# Patient Record
Sex: Male | Born: 1970
Health system: Southern US, Community
[De-identification: ages and names within clinical notes are randomized; demographics above are authoritative.]

## PROBLEM LIST (undated history)

## (undated) DIAGNOSIS — Z720 Tobacco use: Secondary | ICD-10-CM

## (undated) DIAGNOSIS — R079 Chest pain, unspecified: Secondary | ICD-10-CM

## (undated) DIAGNOSIS — I10 Essential (primary) hypertension: Secondary | ICD-10-CM

## (undated) DIAGNOSIS — G4730 Sleep apnea, unspecified: Secondary | ICD-10-CM

## (undated) DIAGNOSIS — Z8249 Family history of ischemic heart disease and other diseases of the circulatory system: Secondary | ICD-10-CM

## (undated) DIAGNOSIS — E785 Hyperlipidemia, unspecified: Secondary | ICD-10-CM

## (undated) DIAGNOSIS — H9190 Unspecified hearing loss, unspecified ear: Secondary | ICD-10-CM

## (undated) HISTORY — DX: Sleep apnea, unspecified: G47.30

## (undated) HISTORY — PX: COLONOSCOPY: SHX174

## (undated) HISTORY — DX: Chest pain, unspecified: R07.9

## (undated) HISTORY — DX: Hyperlipidemia, unspecified: E78.5

---

## 2009-10-19 ENCOUNTER — Emergency Department (HOSPITAL_COMMUNITY): Admission: EM | Admit: 2009-10-19 | Discharge: 2009-10-20 | Payer: Self-pay | Admitting: Emergency Medicine

## 2012-07-13 ENCOUNTER — Encounter (HOSPITAL_BASED_OUTPATIENT_CLINIC_OR_DEPARTMENT_OTHER): Payer: Managed Care, Other (non HMO)

## 2015-01-28 ENCOUNTER — Emergency Department
Admit: 2015-01-28 | Disposition: A | Discharge status: Home / Self Care | Payer: Self-pay | Admitting: Physician Assistant

## 2015-01-28 LAB — COMPREHENSIVE METABOLIC PANEL
ALBUMIN: 3.9 g/dL
ALK PHOS: 92 U/L
ALT: 27 U/L
Anion Gap: 6 — ABNORMAL LOW (ref 7–16)
BUN: 14 mg/dL
Bilirubin,Total: 0.7 mg/dL
CHLORIDE: 100 mmol/L — AB
Calcium, Total: 9.3 mg/dL
Co2: 29 mmol/L
Creatinine: 1.2 mg/dL
EGFR (African American): >60
EGFR (Non-African Amer.): >60
Glucose: 104 mg/dL — ABNORMAL HIGH
POTASSIUM: 3.7 mmol/L
SGOT(AST): 24 U/L
Sodium: 135 mmol/L
Total Protein: 8 g/dL

## 2015-01-28 LAB — CBC
HCT: 45.1 % (ref 40.0–52.0)
HGB: 15.1 g/dL (ref 13.0–18.0)
MCH: 27.9 pg (ref 26.0–34.0)
MCHC: 33.5 g/dL (ref 32.0–36.0)
MCV: 83 fL (ref 80–100)
Platelet: 285 10*3/uL (ref 150–440)
RBC: 5.41 10*6/uL (ref 4.40–5.90)
RDW: 13.3 % (ref 11.5–14.5)
WBC: 11.6 10*3/uL — AB (ref 3.8–10.6)

## 2015-01-30 LAB — BETA STREP CULTURE(ARMC)

## 2015-10-06 ENCOUNTER — Emergency Department (HOSPITAL_COMMUNITY): Payer: 59

## 2015-10-06 ENCOUNTER — Emergency Department (HOSPITAL_COMMUNITY)
Admission: EM | Admit: 2015-10-06 | Discharge: 2015-10-06 | Disposition: A | Discharge status: Home / Self Care | Payer: 59 | Attending: Emergency Medicine | Admitting: Emergency Medicine

## 2015-10-06 ENCOUNTER — Encounter (HOSPITAL_COMMUNITY): Payer: Self-pay | Admitting: *Deleted

## 2015-10-06 DIAGNOSIS — S99922A Unspecified injury of left foot, initial encounter: Secondary | ICD-10-CM | POA: Diagnosis not present

## 2015-10-06 DIAGNOSIS — Y9289 Other specified places as the place of occurrence of the external cause: Secondary | ICD-10-CM | POA: Diagnosis not present

## 2015-10-06 DIAGNOSIS — Y9389 Activity, other specified: Secondary | ICD-10-CM | POA: Insufficient documentation

## 2015-10-06 DIAGNOSIS — W1843XA Slipping, tripping and stumbling without falling due to stepping from one level to another, initial encounter: Secondary | ICD-10-CM | POA: Diagnosis not present

## 2015-10-06 DIAGNOSIS — Y990 Civilian activity done for income or pay: Secondary | ICD-10-CM | POA: Diagnosis not present

## 2015-10-06 MED ORDER — IBUPROFEN 200 MG PO TABS
600.0000 mg | ORAL_TABLET | Freq: Once | ORAL | Status: AC
  Administered 2015-10-06: 600 mg via ORAL
  Filled 2015-10-06: qty 3

## 2015-10-06 MED ORDER — IBUPROFEN 800 MG PO TABS: 800.0000 mg | ORAL_TABLET | Freq: Three times a day (TID) | ORAL | Status: DC | PRN

## 2015-10-06 NOTE — ED Notes (Signed)
Pt complains of pain and swelling to his left ankle since injuring it at 430PM yesterday. 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 Pt states he immediately noticed swelling. Pt states he iced his ankle, which he states helped.

## 2015-10-06 NOTE — ED Provider Notes (Signed)
CSN: [id number]     Arrival date & time 10/06/15  1415 History   By signing my name below, I, Evelene Croon, attest that this documentation has been prepared under the direction and in the presence of non-physician practitioner, Clayton Bibles, PA-C. Electronically Signed: Evelene Croon, Scribe. 10/06/2015. 3:45 PM.  Chief Complaint  Patient presents with  . Ankle Injury   The history is provided by the patient. No language interpreter was used.     HPI Comments:  Caleb Carter is a 44 y.o. male who presents to the Emergency Department complaining of throbbing right foot pain s/p injury ~ 1600 yesterday with associated moderate swelling to the foot. 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  He denies fall and other injury. He notes pain to the arch of the right foot as well. Pt states his pain is exacerbated with movement of the toes and when bearing weight on the foot. He has taken Excedrin with mild relief of pain, last dose yesterday ~ 1700, and applied ice with moderate relief of the swelling.  Denies other injury.    History reviewed. No pertinent past medical history. History reviewed. No pertinent past surgical history. No family history on file. Social History  Substance Use Topics  . Smoking status: Never Smoker   . Smokeless tobacco: None  . Alcohol Use: Yes    Review of Systems  Constitutional: Negative for fever and chills.  Musculoskeletal: Positive for myalgias and arthralgias.       Right foot  Skin: Negative for color change and wound.  Allergic/Immunologic: Negative for immunocompromised state.  Neurological: Negative for weakness and numbness.  Hematological: Does not bruise/bleed easily.  Psychiatric/Behavioral: Negative for self-injury (accidental).    Allergies  Review of patient's allergies indicates not on file.  Home Medications   Prior to Admission medications   Not on File   BP 168/110 mmHg   Pulse 109  Temp(Src) 98.1 F (36.7 C) (Oral)  Resp 18  SpO2 98% Physical Exam  Constitutional: He appears well-developed and well-nourished. No distress.  HENT:  Head: Normocephalic and atraumatic.  Neck: Neck supple.  Pulmonary/Chest: Effort normal.  Musculoskeletal:  Left proximal dorsal foot with diffuse edema  and tenderness just distal to the medial malleolus into the medial aspect of the foot Distal sensation intact FAROM of toes No tenderness of the malleoli; no break in skin Pulses intact  No calf edema or tenderness  Neurological: He is alert.  Skin: He is not diaphoretic.  Nursing note and vitals reviewed.   ED Course  Procedures   DIAGNOSTIC STUDIES:  Oxygen Saturation is 98% on RA, normal by my interpretation.    COORDINATION OF CARE:  3:37 PM Discussed treatment plan with pt at bedside and pt agreed to plan.  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 Osseous mineralization normal. Joint spaces preserved. No acute fracture, dislocation or bone destruction. IMPRESSION: Soft tissue swelling without acute bony abnormalities. Electronically Signed   By: Lavonia Dana M.D.   On: 10/06/2015 16:16  Dg Foot Complete Left  10/06/2015  CLINICAL DATA:  Injury while stepping from forklift with pain EXAM: LEFT FOOT - COMPLETE 3+ VIEW COMPARISON:  None. FINDINGS: Medial soft tissue swelling is noted. No acute fracture or dislocation is seen. IMPRESSION: Soft tissue swelling without acute bony abnormality. Electronically Signed   By: Inez Catalina M.D.   On: 10/06/2015 16:16   I have personally  reviewed and evaluated these images and lab results as part of my medical decision-making.   MDM   Final diagnoses:  Foot injury, left, initial encounter    Afebrile, nontoxic patient with injury to his left foot while stepping off a truck at work.  Significant edema and tenderness over medial left foot.  Neurovascularly intact.  Compartments soft.   Xray negative.  Work note written for 5 days, pt will need to be reassessed by PCP or occupational health if available if he is not improved enough to work at that point.  Pt states there is no light duty at his job. Hypertensive on arrival, rechecked and remains elevated.  Reports he is taking his lisinopril, has an appointment with [facility] (Dr Billey Chang) on Jan 10.  He is unaware if his BP is always this high or if it is elevated because of his foot - notes significant pain in his foot but declines pain medication.  Pt strongly advised to recheck his BP several days this week and write down the numbers, if it remains elevated to please follow up with PCP or urgent care - [facility] has Urgent Care pt may make use of.  D/C home with ace, postop shoe.  Pt declines crutches.  Discussed result, findings, treatment, and follow up  with patient.  Pt given return precautions.  Pt verbalizes understanding and agrees with plan.       I personally performed the services described in this documentation, which was scribed in my presence. The recorded information has been reviewed and is accurate.   Clayton Bibles, PA-C 10/06/15 1714  Veryl Speak, MD 10/07/15 [phone]

## 2015-10-06 NOTE — Discharge Instructions (Signed)
Read the information below.  You may return to the Emergency Department at any time for worsening condition or any new symptoms that concern you.  If you develop uncontrolled pain, weakness or numbness of the extremity, severe discoloration of the skin, or you are unable to walk or move your ankle or toes, return to the ER for a recheck.

## 2017-04-10 ENCOUNTER — Emergency Department (HOSPITAL_COMMUNITY): Payer: Federal, State, Local not specified - PPO

## 2017-04-10 ENCOUNTER — Encounter (HOSPITAL_COMMUNITY): Payer: Self-pay

## 2017-04-10 ENCOUNTER — Observation Stay (HOSPITAL_COMMUNITY)
Admission: EM | Admit: 2017-04-10 | Discharge: 2017-04-11 | Disposition: A | Discharge status: Home / Self Care | Payer: Federal, State, Local not specified - PPO | Attending: Internal Medicine | Admitting: Internal Medicine

## 2017-04-10 DIAGNOSIS — Z720 Tobacco use: Secondary | ICD-10-CM | POA: Diagnosis not present

## 2017-04-10 DIAGNOSIS — I10 Essential (primary) hypertension: Secondary | ICD-10-CM | POA: Diagnosis not present

## 2017-04-10 DIAGNOSIS — F17210 Nicotine dependence, cigarettes, uncomplicated: Secondary | ICD-10-CM | POA: Diagnosis not present

## 2017-04-10 DIAGNOSIS — H9191 Unspecified hearing loss, right ear: Secondary | ICD-10-CM | POA: Diagnosis not present

## 2017-04-10 DIAGNOSIS — H9190 Unspecified hearing loss, unspecified ear: Secondary | ICD-10-CM

## 2017-04-10 DIAGNOSIS — R079 Chest pain, unspecified: Secondary | ICD-10-CM | POA: Diagnosis not present

## 2017-04-10 DIAGNOSIS — I455 Other specified heart block: Secondary | ICD-10-CM

## 2017-04-10 DIAGNOSIS — Z8249 Family history of ischemic heart disease and other diseases of the circulatory system: Secondary | ICD-10-CM | POA: Insufficient documentation

## 2017-04-10 DIAGNOSIS — R0781 Pleurodynia: Secondary | ICD-10-CM | POA: Diagnosis present

## 2017-04-10 DIAGNOSIS — E785 Hyperlipidemia, unspecified: Principal | ICD-10-CM | POA: Insufficient documentation

## 2017-04-10 DIAGNOSIS — H918X1 Other specified hearing loss, right ear: Secondary | ICD-10-CM | POA: Diagnosis not present

## 2017-04-10 DIAGNOSIS — G4733 Obstructive sleep apnea (adult) (pediatric): Secondary | ICD-10-CM

## 2017-04-10 DIAGNOSIS — Z9989 Dependence on other enabling machines and devices: Secondary | ICD-10-CM

## 2017-04-10 DIAGNOSIS — I491 Atrial premature depolarization: Secondary | ICD-10-CM

## 2017-04-10 DIAGNOSIS — R001 Bradycardia, unspecified: Secondary | ICD-10-CM

## 2017-04-10 HISTORY — DX: Family history of ischemic heart disease and other diseases of the circulatory system: Z82.49

## 2017-04-10 HISTORY — DX: Tobacco use: Z72.0

## 2017-04-10 HISTORY — DX: Unspecified hearing loss, unspecified ear: H91.90

## 2017-04-10 HISTORY — DX: Essential (primary) hypertension: I10

## 2017-04-10 LAB — CREATININE, SERUM
CREATININE: 1.21 mg/dL (ref 0.61–1.24)
GFR calc Af Amer: >60 mL/min (ref >60)

## 2017-04-10 LAB — CBC
HEMATOCRIT: 36.7 % — AB (ref 39.0–52.0)
Hemoglobin: 12.3 g/dL — ABNORMAL LOW (ref 13.0–17.0)
MCH: 28.2 pg (ref 26.0–34.0)
MCHC: 33.5 g/dL (ref 30.0–36.0)
MCV: 84.2 fL (ref 78.0–100.0)
Platelets: 242 10*3/uL (ref 150–400)
RBC: 4.36 MIL/uL (ref 4.22–5.81)
RDW: 12.8 % (ref 11.5–15.5)
WBC: 8.9 10*3/uL (ref 4.0–10.5)

## 2017-04-10 LAB — CBC WITH DIFFERENTIAL/PLATELET
Basophils Absolute: 0 10*3/uL (ref 0.0–0.1)
Basophils Relative: 0 %
EOS ABS: 0.1 10*3/uL (ref 0.0–0.7)
EOS PCT: 1 %
HCT: 38.5 % — ABNORMAL LOW (ref 39.0–52.0)
HEMOGLOBIN: 13.1 g/dL (ref 13.0–17.0)
LYMPHS ABS: 1.7 10*3/uL (ref 0.7–4.0)
Lymphocytes Relative: 17 %
MCH: 28.8 pg (ref 26.0–34.0)
MCHC: 34 g/dL (ref 30.0–36.0)
MCV: 84.6 fL (ref 78.0–100.0)
MONO ABS: 0.5 10*3/uL (ref 0.1–1.0)
MONOS PCT: 5 %
NEUTROS PCT: 77 %
Neutro Abs: 7.9 10*3/uL — ABNORMAL HIGH (ref 1.7–7.7)
Platelets: 257 10*3/uL (ref 150–400)
RBC: 4.55 MIL/uL (ref 4.22–5.81)
RDW: 12.8 % (ref 11.5–15.5)
WBC: 10.2 10*3/uL (ref 4.0–10.5)

## 2017-04-10 LAB — COMPREHENSIVE METABOLIC PANEL
ALBUMIN: 3.6 g/dL (ref 3.5–5.0)
ALK PHOS: 69 U/L (ref 38–126)
ALT: 22 U/L (ref 17–63)
AST: 25 U/L (ref 15–41)
Anion gap: 8 (ref 5–15)
BUN: 9 mg/dL (ref 6–20)
CALCIUM: 9.4 mg/dL (ref 8.9–10.3)
CHLORIDE: 106 mmol/L (ref 101–111)
CO2: 26 mmol/L (ref 22–32)
CREATININE: 1.23 mg/dL (ref 0.61–1.24)
GFR calc Af Amer: >60 mL/min (ref >60)
GFR calc non Af Amer: >60 mL/min (ref >60)
GLUCOSE: 88 mg/dL (ref 65–99)
Potassium: 3.7 mmol/L (ref 3.5–5.1)
SODIUM: 140 mmol/L (ref 135–145)
Total Bilirubin: 0.7 mg/dL (ref 0.3–1.2)
Total Protein: 6.8 g/dL (ref 6.5–8.1)

## 2017-04-10 LAB — PROTIME-INR
INR: 1.06
Prothrombin Time: 13.8 seconds (ref 11.4–15.2)

## 2017-04-10 LAB — TROPONIN I: Troponin I: <0.03 ng/mL (ref <0.03)

## 2017-04-10 LAB — TSH: TSH: 1.479 u[IU]/mL (ref 0.350–4.500)

## 2017-04-10 LAB — D-DIMER, QUANTITATIVE (NOT AT ARMC)

## 2017-04-10 LAB — I-STAT TROPONIN, ED: Troponin i, poc: 0 ng/mL (ref 0.00–0.08)

## 2017-04-10 MED ORDER — SODIUM CHLORIDE 0.9% FLUSH
3.0000 mL | Freq: Two times a day (BID) | INTRAVENOUS | Status: DC
  Administered 2017-04-11: 3 mL via INTRAVENOUS

## 2017-04-10 MED ORDER — HEPARIN SODIUM (PORCINE) 5000 UNIT/ML IJ SOLN
5000.0000 [IU] | Freq: Three times a day (TID) | INTRAMUSCULAR | Status: DC
  Administered 2017-04-10 – 2017-04-11 (×3): 5000 [IU] via SUBCUTANEOUS
  Filled 2017-04-10 (×3): qty 1

## 2017-04-10 MED ORDER — ONDANSETRON HCL 4 MG/2ML IJ SOLN: 4.0000 mg | Freq: Four times a day (QID) | INTRAMUSCULAR | Status: DC | PRN

## 2017-04-10 MED ORDER — LISINOPRIL-HYDROCHLOROTHIAZIDE 10-12.5 MG PO TABS
1.0000 | ORAL_TABLET | Freq: Every day | ORAL | Status: DC
Start: 2017-04-10 — End: 2017-04-10

## 2017-04-10 MED ORDER — NITROGLYCERIN 0.4 MG SL SUBL: 0.4000 mg | SUBLINGUAL_TABLET | SUBLINGUAL | Status: DC | PRN

## 2017-04-10 MED ORDER — SODIUM CHLORIDE 0.9% FLUSH: 3.0000 mL | INTRAVENOUS | Status: DC | PRN

## 2017-04-10 MED ORDER — ASPIRIN EC 81 MG PO TBEC
81.0000 mg | DELAYED_RELEASE_TABLET | Freq: Every day | ORAL | Status: DC
  Administered 2017-04-11: 81 mg via ORAL
  Filled 2017-04-10: qty 1

## 2017-04-10 MED ORDER — ACETAMINOPHEN 325 MG PO TABS
650.0000 mg | ORAL_TABLET | ORAL | Status: DC | PRN
  Filled 2017-04-10: qty 2

## 2017-04-10 MED ORDER — HYDROCHLOROTHIAZIDE 12.5 MG PO CAPS
12.5000 mg | ORAL_CAPSULE | Freq: Every day | ORAL | Status: DC
  Administered 2017-04-10 – 2017-04-11 (×2): 12.5 mg via ORAL
  Filled 2017-04-10 (×2): qty 1

## 2017-04-10 MED ORDER — SODIUM CHLORIDE 0.9 % IV SOLN: 250.0000 mL | INTRAVENOUS | Status: DC | PRN

## 2017-04-10 MED ORDER — LISINOPRIL 10 MG PO TABS
10.0000 mg | ORAL_TABLET | Freq: Every day | ORAL | Status: DC
  Administered 2017-04-10 – 2017-04-11 (×2): 10 mg via ORAL
  Filled 2017-04-10 (×2): qty 1

## 2017-04-10 MED ORDER — ATORVASTATIN CALCIUM 40 MG PO TABS: 40.0000 mg | ORAL_TABLET | Freq: Every day | ORAL | Status: DC

## 2017-04-10 MED ORDER — NITROGLYCERIN 0.4 MG SL SUBL
0.4000 mg | SUBLINGUAL_TABLET | SUBLINGUAL | Status: DC | PRN
  Administered 2017-04-10 (×2): 0.4 mg via SUBLINGUAL
  Filled 2017-04-10 (×3): qty 1

## 2017-04-10 NOTE — ED Triage Notes (Signed)
Per EMS: Pt complaining of central chest pain that radiates to back. Pt states some SOB. EMS gave 324 ASA and 1 nitro enroute. Pt states pain better upon arrival.

## 2017-04-10 NOTE — ED Notes (Addendum)
Attempted to call report x 1  

## 2017-04-10 NOTE — H&P (Signed)
Patient ID: Caleb Carter MRN: [id number], DOB/AGE: 46-02-1971   Admit date: 04/10/2017   Primary Physician: [facility] Primary Cardiologist: [facility]- Dr. Debara Pickett  Reason for admission: chest pain   HPI:  Caleb Carter is a 46 y.o. male with a history of HTN, HLD, OSA on CPAP, tobacco abuse and family history of CAD who presented to [facility] ED today with chest pain.  He has had some intermittent chest pressure in the past that is noted with yard work. It usually passes quickly with belching or sitting still. He does not follow regularly with a PCP but does go enough to get his Lisinopril-HCT when he runs out.   He works at the [employer]. He is fairly active at work but does not formally exercise. He was walking into work today when he had sudden onset of chest squeezing with radiation to his back associated with shortness of breath and diaphoresis. No nausea. He stopped and waited for the chest pain the past but it did not. He went into his supervisor's office who called EMS.  He was brought to [facility] where lab work was unremarkable. Troponin negative 1. ECG with NSR with early re-pol.Telemetry reveals NSR with PACs and some bradycardia with a 3 second pause. Chest pain was relieved with one sublingual nitroglycerin. He is currently chest pain-free but can feel slight discomfort when he takes a deep breath in. No LE edema, orthopnea or PND. No recent dizziness or syncope. No blood in stool or urine. No palpitations. He occasionally smokes a cigarette. He denies illicit drug use. Occasionally drinks alcohol.    Problem List  Past Medical History:  Diagnosis Date  . Family history of early CAD   . HLD (hyperlipidemia)   . HOH (hard of hearing)    a. wears hearing aid  . Hypertension   . Tobacco abuse     History reviewed. No pertinent surgical history.   Allergies  No Known Allergies   Home Medications  Prior to  Admission medications   Medication Sig Start Date End Date Taking? Authorizing Provider  lisinopril-hydrochlorothiazide (PRINZIDE,ZESTORETIC) 10-12.5 MG tablet Take 1 tablet by mouth daily.   Yes [provider]  ibuprofen (ADVIL,MOTRIN) 800 MG tablet Take 1 tablet (800 mg total) by mouth every 8 (eight) hours as needed for mild pain or moderate pain. 10/06/15   Clayton Bibles, PA-C    Family History  Family History  Problem Relation Age of Onset  . Stroke Father   . Hypertension Father   . Hypertension Sister   . Heart attack Paternal Grandmother   . Heart attack Paternal Uncle 32       Younger than age 74   Family Status  Relation Status  . Mother Alive  . Father (Not Specified)  . Sister (Not Specified)  . PGM (Not Specified)  . Annamarie Major (Not Specified)     Social History  Social History   Social History  . Marital status: Single    Spouse name: N/A  . Number of children: N/A  . Years of education: N/A   Occupational History  . Not on file.   Social History Main Topics  . Smoking status: Current Some Day Smoker  . Smokeless tobacco: Never Used  . Alcohol use Yes  . Drug use: Unknown  . Sexual activity: Not on file   Other Topics Concern  . Not on file   Social History Narrative  .  No narrative on file     Review of Systems General:  No chills, fever, night sweats or weight changes.  Cardiovascular:  +++ chest pain, No dyspnea on exertion, edema, orthopnea, palpitations, paroxysmal nocturnal dyspnea. Dermatological: No rash, lesions/masses Respiratory: No cough, dyspnea Urologic: No hematuria, dysuria Abdominal:   No nausea, vomiting, diarrhea, bright red blood per rectum, melena, or hematemesis Neurologic:  No visual changes, wkns, changes in mental status. All other systems reviewed and are otherwise negative except as noted above.  Physical Exam  Blood pressure (!) 143/93, pulse 69, temperature 98.3 F (36.8 C), temperature source Oral,  resp. rate 12, SpO2 99 %.  General: Pleasant, NAD Psych: Normal affect. Neuro: Alert and oriented X 3. Moves all extremities spontaneously. HEENT: Normal  Neck: Supple without bruits or JVD. Lungs:  Resp regular and unlabored, CTA. Heart: RRR no s3, s4, or murmurs. Abdomen: Soft, non-tender, non-distended, BS + x 4.  Extremities: No clubbing, cyanosis or edema. DP/PT/Radials 2+ and equal bilaterally.  Labs  No results for input(s): CKTOTAL, CKMB, TROPONINI in the last 72 hours. Lab Results  Component Value Date   WBC 10.2 04/10/2017   HGB 13.1 04/10/2017   HCT 38.5 (L) 04/10/2017   MCV 84.6 04/10/2017   PLT 257 04/10/2017     Recent Labs Lab 04/10/17 1810  NA 140  K 3.7  CL 106  CO2 26  BUN 9  CREATININE 1.23  CALCIUM 9.4  PROT 6.8  BILITOT 0.7  ALKPHOS 69  ALT 22  AST 25  GLUCOSE 88   No results found for: CHOL, HDL, LDLCALC, TRIG Lab Results  Component Value Date   DDIMER <0.27 04/10/2017     Radiology/Studies  Dg Chest 2 View  Result Date: 04/10/2017 CLINICAL DATA:  Chest pain for a while, now persistent. EXAM: CHEST  2 VIEW COMPARISON:  01/28/2015. FINDINGS: The heart size and mediastinal contours are within normal limits. Both lungs are clear. The visualized skeletal structures are unremarkable. Mild mid thoracic dextroconvex scoliosis similar to priors. IMPRESSION: No active cardiopulmonary disease. Electronically Signed   By: Staci Righter M.D.   On: 04/10/2017 18:52    ECG  Sinus brady HR 59 with early repol in V2-V3 and lead I and AVL   ASSESSMENT AND PLAN  Chest pain: With typical and atypical features. No objective signs of ischemia at this point with negative troponin and nonacute ECG. Given risk factors including hypertension, hyperlipidemia, tobacco abuse and family history we will admit overnight for observation. Also noted on telemetry some bradycardia with up to 3 second pause raising question for ischemia induced conduction abnormalities.  Will admit and cycle cardiac enzymes and serial EKGs. Will obtain a fasting lipid panel. Would not start IV heparin unless enzymes return positive. Will order echo and make NPO after midnight for possible stress tomorrow.   Bradycardia with pause: could be ischemia mediated. Continue to follow. Would hold off on BB.   HTN: Mildly hypertensive here. Will resume home lisinopril-HCT and continue to monitor.  HLD: Will obtain a fasting lipid panel  OSA: he is basically complaint with CPAP  Signed, Angelena Form, PA-C 04/10/2017, 7:59 PM  Pager [phone]

## 2017-04-10 NOTE — ED Provider Notes (Signed)
[facility] DEPT Provider Note   CSN: [id number] Arrival date & time: 04/10/17  1700     History   Chief Complaint Chief Complaint  Patient presents with  . Chest Pain    HPI Caleb Carter is a 46 y.o. male.  HPI   Caleb Carter is a 46 y.o. male, with a history of HTN, presenting to the ED with chest pain beginning today around 1415. Began while walking. Describes it as a tightness "like something is squeezing," 8/10, central chest, radiating to the left chest and back. Received 324mg  ASA and 1 NTG with EMS. Has improved the pain to 4/10. Accompanied by diaphoresis and shortness of breath. Has experienced similar pain before, but today's pain has not resolved with rest or with deep breathing. He also previously didn't have radiation to the back.    Denies dizziness, cough, fever/chills, N/V/D, or any other complaints.    Denies history of PE/DVT, cancer, recent surgery, trauma, immobilization. Patient does not go to the doctor regularly.    Past Medical History:  Diagnosis Date  . Family history of early CAD   . HLD (hyperlipidemia)   . HOH (hard of hearing)    a. wears hearing aid  . Hypertension   . Tobacco abuse     Patient Active Problem List   Diagnosis Date Noted  . Family history of early CAD   . HLD (hyperlipidemia)   . Hypertension   . Tobacco abuse   . HOH (hard of hearing)     History reviewed. No pertinent surgical history.     Home Medications    Prior to Admission medications   Medication Sig Start Date End Date Taking? Authorizing Provider  lisinopril-hydrochlorothiazide (PRINZIDE,ZESTORETIC) 10-12.5 MG tablet Take 1 tablet by mouth daily.   Yes [provider]  ibuprofen (ADVIL,MOTRIN) 800 MG tablet Take 1 tablet (800 mg total) by mouth every 8 (eight) hours as needed for mild pain or moderate pain. 10/06/15   Clayton Bibles, PA-C    Family History Family History  Problem Relation Age of Onset  . Stroke Father   .  Hypertension Father   . Hypertension Sister   . Heart attack Paternal Grandmother   . Heart attack Paternal Uncle 32       Younger than age 41    Social History Social History  Substance Use Topics  . Smoking status: Current Some Day Smoker  . Smokeless tobacco: Never Used  . Alcohol use Yes     Allergies   Patient has no known allergies.   Review of Systems Review of Systems  Constitutional: Positive for diaphoresis. Negative for chills and fever.  Respiratory: Positive for shortness of breath. Negative for cough.   Cardiovascular: Positive for chest pain. Negative for palpitations and leg swelling.  Gastrointestinal: Negative for abdominal pain, diarrhea, nausea and vomiting.  Neurological: Positive for dizziness. Negative for syncope, weakness and headaches.  All other systems reviewed and are negative.    Physical Exam Updated Vital Signs BP (!) 140/97 (BP Location: Right Arm)   Pulse 64   Temp 98.3 F (36.8 C) (Oral)   Resp 20   SpO2 96%   Physical Exam  Constitutional: He appears well-developed and well-nourished. No distress.  HENT:  Head: Normocephalic and atraumatic.  Eyes: Conjunctivae are normal.  Neck: Neck supple.  Cardiovascular: Normal rate, regular rhythm, normal heart sounds and intact distal pulses.   Pulses are equal bilaterally in both upper and lower extremities.  Pulmonary/Chest: Effort normal and  breath sounds normal. No respiratory distress.  Abdominal: Soft. There is no tenderness. There is no guarding.  Musculoskeletal: He exhibits no edema.  Lymphadenopathy:    He has no cervical adenopathy.  Neurological: He is alert.  Skin: Skin is warm and dry. He is not diaphoretic.  Psychiatric: He has a normal mood and affect. His behavior is normal.  Nursing note and vitals reviewed.    ED Treatments / Results  Labs (all labs ordered are listed, but only abnormal results are displayed) Labs Reviewed  CBC WITH DIFFERENTIAL/PLATELET -  Abnormal; Notable for the following:       Result Value   HCT 38.5 (*)    Neutro Abs 7.9 (*)    All other components within normal limits  COMPREHENSIVE METABOLIC PANEL  D-DIMER, QUANTITATIVE (NOT AT [facility])  Randolm Idol, ED    EKG  EKG Interpretation  Date/Time:  Friday April 10 2017 17:02:16 EDT Ventricular Rate:  59 PR Interval:    QRS Duration: 83 QT Interval:  379 QTC Calculation: 376 R Axis:   42 Text Interpretation:  Sinus rhythm ST elev, probable normal early repol pattern Confirmed by Duffy Bruce [phone]) on 04/10/2017 5:13:21 PM       Radiology Dg Chest 2 View  Result Date: 04/10/2017 CLINICAL DATA:  Chest pain for a while, now persistent. EXAM: CHEST  2 VIEW COMPARISON:  01/28/2015. FINDINGS: The heart size and mediastinal contours are within normal limits. Both lungs are clear. The visualized skeletal structures are unremarkable. Mild mid thoracic dextroconvex scoliosis similar to priors. IMPRESSION: No active cardiopulmonary disease. Electronically Signed   By: Staci Righter M.D.   On: 04/10/2017 18:52    Procedures Procedures (including critical care time)  Medications Ordered in ED Medications  nitroGLYCERIN (NITROSTAT) SL tablet 0.4 mg (0.4 mg Sublingual Given 04/10/17 1930)     Initial Impression / Assessment and Plan / ED Course  I have reviewed the triage vital signs and the nursing notes.  Pertinent labs & imaging results that were available during my care of the patient were reviewed by me and considered in my medical decision making (see chart for details).  Clinical Course as of Apr 11 2007  Fri Apr 10, 2017  1935 Spoke with Dr. Debara Pickett, cardiology, who states he will have someone from his team come assess the patient.  [SJ]    Clinical Course User Index [SJ] Joy, Shawn C, PA-C    Patient presents with chest pain with a story concerning for ACS. HEART score of 6. Lower suspicion for thoracic aortic dissection based on equal bilateral  pulses, negative d-dimer, normal chest x-ray, and low risk overall. Cardiology evaluation and admission.   Findings and plan of care discussed with Duffy Bruce, MD. Dr. Ellender Hose personally evaluated and examined this patient.   Vitals:   04/10/17 1730 04/10/17 1805 04/10/17 1830 04/10/17 1900  BP: 137/90 (!) 144/97 (!) 145/99 (!) 143/93  Pulse: (!) 59 (!) 58 (!) 56 69  Resp: 17 15 14 12   Temp:      TempSrc:      SpO2: 98% 99% 100% 99%     Final Clinical Impressions(s) / ED Diagnoses   Final diagnoses:  Family history of early CAD  Hyperlipidemia, unspecified hyperlipidemia type  Essential hypertension  Tobacco abuse  Other specified hearing loss of right ear, unspecified hearing status on contralateral side    New Prescriptions New Prescriptions   No medications on file     Joy, Shawn C, PA-C  04/10/17 2008    Duffy Bruce, MD 04/11/17 1308

## 2017-04-10 NOTE — ED Notes (Signed)
Patient is stable and ready to be transport to the floor at this time.  Report was called to 3W RN.  Belongings taken with the patient to the floor.   

## 2017-04-11 ENCOUNTER — Observation Stay (HOSPITAL_BASED_OUTPATIENT_CLINIC_OR_DEPARTMENT_OTHER): Payer: Federal, State, Local not specified - PPO

## 2017-04-11 DIAGNOSIS — R079 Chest pain, unspecified: Secondary | ICD-10-CM

## 2017-04-11 DIAGNOSIS — R001 Bradycardia, unspecified: Secondary | ICD-10-CM

## 2017-04-11 LAB — CBC
HEMATOCRIT: 37.8 % — AB (ref 39.0–52.0)
HEMOGLOBIN: 12.6 g/dL — AB (ref 13.0–17.0)
MCH: 28.1 pg (ref 26.0–34.0)
MCHC: 33.3 g/dL (ref 30.0–36.0)
MCV: 84.4 fL (ref 78.0–100.0)
Platelets: 243 10*3/uL (ref 150–400)
RBC: 4.48 MIL/uL (ref 4.22–5.81)
RDW: 12.9 % (ref 11.5–15.5)
WBC: 8.5 10*3/uL (ref 4.0–10.5)

## 2017-04-11 LAB — HIV ANTIBODY (ROUTINE TESTING W REFLEX): HIV SCREEN 4TH GENERATION: NONREACTIVE

## 2017-04-11 LAB — COMPREHENSIVE METABOLIC PANEL
ALT: 18 U/L (ref 17–63)
AST: 20 U/L (ref 15–41)
Albumin: 3.3 g/dL — ABNORMAL LOW (ref 3.5–5.0)
Alkaline Phosphatase: 66 U/L (ref 38–126)
Anion gap: 8 (ref 5–15)
BILIRUBIN TOTAL: 0.8 mg/dL (ref 0.3–1.2)
BUN: 8 mg/dL (ref 6–20)
CO2: 27 mmol/L (ref 22–32)
Calcium: 9.2 mg/dL (ref 8.9–10.3)
Chloride: 105 mmol/L (ref 101–111)
Creatinine, Ser: 1.24 mg/dL (ref 0.61–1.24)
Glucose, Bld: 75 mg/dL (ref 65–99)
POTASSIUM: 3.4 mmol/L — AB (ref 3.5–5.1)
Sodium: 140 mmol/L (ref 135–145)
TOTAL PROTEIN: 6.2 g/dL — AB (ref 6.5–8.1)

## 2017-04-11 LAB — LIPID PANEL
CHOL/HDL RATIO: 3.3 ratio
CHOLESTEROL: 143 mg/dL (ref 0–200)
HDL: 43 mg/dL (ref >40)
LDL Cholesterol: 89 mg/dL (ref 0–99)
TRIGLYCERIDES: 54 mg/dL (ref <150)
VLDL: 11 mg/dL (ref 0–40)

## 2017-04-11 LAB — TROPONIN I
TROPONIN I: 0.03 ng/mL — AB (ref <0.03)
Troponin I: <0.03 ng/mL (ref <0.03)

## 2017-04-11 LAB — ECHOCARDIOGRAM COMPLETE
HEIGHTINCHES: 70 in
Weight: 3582.4 oz

## 2017-04-11 MED ORDER — TECHNETIUM TC 99M TETROFOSMIN IV KIT
10.0000 | PACK | Freq: Once | INTRAVENOUS | Status: AC | PRN
  Administered 2017-04-11: 10 via INTRAVENOUS

## 2017-04-11 MED ORDER — HYDROCHLOROTHIAZIDE 12.5 MG PO CAPS: 12.5000 mg | ORAL_CAPSULE | Freq: Every day | ORAL | 3 refills | Status: DC

## 2017-04-11 MED ORDER — POTASSIUM CHLORIDE CRYS ER 20 MEQ PO TBCR
40.0000 meq | EXTENDED_RELEASE_TABLET | Freq: Once | ORAL | Status: AC
  Administered 2017-04-11: 40 meq via ORAL
  Filled 2017-04-11: qty 2

## 2017-04-11 MED ORDER — HYDROCHLOROTHIAZIDE 12.5 MG PO CAPS: 12.5000 mg | ORAL_CAPSULE | Freq: Every day | ORAL | Status: DC

## 2017-04-11 MED ORDER — REGADENOSON 0.4 MG/5ML IV SOLN
0.4000 mg | Freq: Once | INTRAVENOUS | Status: AC
  Administered 2017-04-11: 0.4 mg via INTRAVENOUS
  Filled 2017-04-11: qty 5

## 2017-04-11 MED ORDER — LISINOPRIL 20 MG PO TABS: 20.0000 mg | ORAL_TABLET | Freq: Every day | ORAL | Status: DC

## 2017-04-11 MED ORDER — LISINOPRIL 20 MG PO TABS: 20.0000 mg | ORAL_TABLET | Freq: Every day | ORAL | 3 refills | Status: DC

## 2017-04-11 MED ORDER — LISINOPRIL 10 MG PO TABS
10.0000 mg | ORAL_TABLET | Freq: Once | ORAL | Status: AC
  Administered 2017-04-11: 10 mg via ORAL
  Filled 2017-04-11: qty 1

## 2017-04-11 MED ORDER — REGADENOSON 0.4 MG/5ML IV SOLN
INTRAVENOUS | Status: AC
  Administered 2017-04-11: 0.4 mg via INTRAVENOUS
  Filled 2017-04-11: qty 5

## 2017-04-11 NOTE — Progress Notes (Signed)
   Marvel Plan presented for a nuclear stress test today.  No immediate complications.  Stress imaging is pending at this time.  Preliminary EKG findings may be listed in the chart, but the stress test result will not be finalized until perfusion imaging is complete.  Charlie Pitter, PA-C 04/11/2017, 11:49 AM

## 2017-04-11 NOTE — Progress Notes (Signed)
"  Critical" troponin level of 0.03. Discussed results with oncall cardiologist. Given orders to continue with DC.

## 2017-04-11 NOTE — Discharge Summary (Signed)
Discharge Summary    Patient ID: Caleb Carter,  MRN: [id number], DOB/AGE: Oct 23, 1970 46 y.o.  Admit date: 04/10/2017 Discharge date: 04/11/2017  Primary Care Provider: [facility] Primary Cardiologist: [facility]- Dr. Debara Pickett  Discharge Diagnoses    Principal Problem:   Chest pain Active Problems:   HLD (hyperlipidemia)   Hypertension   Tobacco abuse   HOH (hard of hearing)   Bradycardia    Diagnostic Studies/Procedures    2D echo 03/22/17 Study Conclusions  - Left ventricle: The cavity size was normal. Wall thickness was   increased in a pattern of mild LVH. Systolic function was normal.   The estimated ejection fraction was in the range of 55% to 60%.   Wall motion was normal; there were no regional wall motion   abnormalities. Left ventricular diastolic function parameters   were normal. - Atrial septum: No defect or patent foramen ovale was identified.  Nuc result reviewed by Dr. Johnsie Cancel and felt to be normal, EF 53%. _____________     History of Present Illness     Caleb Carter is a 46 y.o. male with history of HTN, HLD, OSA on CPAP, tobacco abuse and family history of CAD who to [facility] with chest pain.  He has history of some intermittent chest pressure in the past noted with yard work. It usually would pass quickly with belching or sitting still. He works at the [employer]. He is fairly active at work but does not formally exercise. He was walking into work day of admission when he had sudden onset of chest squeezing with radiation to his back associated with shortness of breath and diaphoresis. No nausea. He stopped and waited for the chest pain the past but it did not. He went into his supervisor's office who called EMS. Chest pain was relieved with one sublingual nitroglycerin. In the ED yesterday he could still feel slight discomfort upon taking a deep breath in. No LE edema, orthopnea or PND. No recent  dizziness or syncope.   Hospital Course    He was brought to [facility] where lab work was generally unremarkable. EKG showed NSR with early repol. CXR nonacute. Not hypoxic. Telemetry revealed NSR with PACs and some bradycardia with a 3 second pause versus accentuated sinus arrhythmia. He is not on any AVN blocking agents. He was observed overnight and troponins remained negative. D-dimer was normal. TSH wnl. VSS revealed episodically elevated blood pressure up to 169/101 (after getting BP meds late), lowest reading 118/73 earlier this AM. Potassium was 3.4 this AM and was repleted. CXR with normal mediastinal contours, and normal distal pulses. He underwent nuclear stress test today without significant EKG changes. The report was due to be read by [facility] but is still not back yet, thus Dr. Johnsie Cancel has overread it and feels it is normal with EF 53%. 2D echo showed mild LVH, EF 55-60%, no RWMA, normal diastolic parameters. Lisinopril dose was increased to 20mg  daily. He ambulated this afternoon after eating lunch and said he felt much better without subsequent chest pain or dyspnea during ambulation. Dr. Johnsie Cancel has seen and examined the patient today and feels he is stable for discharge. I have sent a message to our office's scheduler requesting a follow-up appointment, and our office will call the patient with this information.  He will need labwork in follow-up to recheck potassium and creatinine. If BP is stable at that time would consider consolidating him once more  to the combo lisinopril/HCTZ as previously prescribed.  Spoke with wife (at patient's permission) to give further details on med adjustment. Per discussion with Dr. Johnsie Cancel, recommend appt in 1 week before clearing to return to work moving boxes at the [employer]. Work note provided.  _____________  Discharge Vitals Blood pressure (!) 148/95, pulse 61, temperature 98.4 F (36.9 C), temperature source Oral, resp. rate  16, height 5\' 10"  (1.778 m), weight 223 lb 14.4 oz (101.6 kg), SpO2 100 %.  Filed Weights   04/10/17 2152 04/11/17 0500  Weight: 223 lb 6.4 oz (101.3 kg) 223 lb 14.4 oz (101.6 kg)    Labs & Radiologic Studies    CBC  Recent Labs  04/10/17 1810 04/10/17 2155 04/11/17 0323  WBC 10.2 8.9 8.5  NEUTROABS 7.9*  --   --   HGB 13.1 12.3* 12.6*  HCT 38.5* 36.7* 37.8*  MCV 84.6 84.2 84.4  PLT 257 242 299   Basic Metabolic Panel  Recent Labs  04/10/17 1810 04/10/17 2155 04/11/17 0323  NA 140  --  140  K 3.7  --  3.4*  CL 106  --  105  CO2 26  --  27  GLUCOSE 88  --  75  BUN 9  --  8  CREATININE 1.23 1.21 1.24  CALCIUM 9.4  --  9.2   Liver Function Tests  Recent Labs  04/10/17 1810 04/11/17 0323  AST 25 20  ALT 22 18  ALKPHOS 69 66  BILITOT 0.7 0.8  PROT 6.8 6.2*  ALBUMIN 3.6 3.3*   No results for input(s): LIPASE, AMYLASE in the last 72 hours. Cardiac Enzymes  Recent Labs  04/10/17 2155 04/11/17 0323  TROPONINI <0.03 <0.03   BNP Invalid input(s): POCBNP D-Dimer  Recent Labs  04/10/17 1810  DDIMER <0.27   Hemoglobin A1C No results for input(s): HGBA1C in the last 72 hours. Fasting Lipid Panel  Recent Labs  04/11/17 0323  CHOL 143  HDL 43  LDLCALC 89  TRIG 54  CHOLHDL 3.3   Thyroid Function Tests  Recent Labs  04/10/17 2155  TSH 1.479   _____________  Dg Chest 2 View  Result Date: 04/10/2017 CLINICAL DATA:  Chest pain for a while, now persistent. EXAM: CHEST  2 VIEW COMPARISON:  01/28/2015. FINDINGS: The heart size and mediastinal contours are within normal limits. Both lungs are clear. The visualized skeletal structures are unremarkable. Mild mid thoracic dextroconvex scoliosis similar to priors. IMPRESSION: No active cardiopulmonary disease. Electronically Signed   By: Staci Righter M.D.   On: 04/10/2017 18:52   Disposition   Pt is being discharged home today in good condition.  Follow-up Plans & Appointments    Follow-up  Information    Hilty, Nadean Corwin, MD Follow up.   Specialty:  Cardiology Why:  Our office will call you for a follow-up appointment. Please call the office if you have not heard from [place] within 3 days. Contact information: [address] [place] [address] [phone]          Discharge Instructions    Diet - low sodium heart healthy    Complete by:  As directed    Increase activity slowly    Complete by:  As directed    Please monitor your blood pressure occasionally at home. Call your doctor if you tend to get readings of greater than 130 on the top number or 80 on the bottom number.  Dr. Johnsie Cancel recommends you remain out of  work until seen back in the office for follow-up to make sure you are feeling well and blood pressure is controlled.  If you develop worsening chest pain, shortness of breath, nausea, clamminess, just do not feel right, or any other symptoms, return to the ER.      Discharge Medications   Allergies as of 04/11/2017      Reactions   Fish Allergy Swelling, Rash   Throat swelling   Other Swelling, Rash   Tree nuts cause throat swelling and rash Powder in gloves causes rash   Strawberry Flavor Swelling, Rash   Throat swelling   Tomato Swelling, Rash   Throat swelling      Medication List    STOP taking these medications   lisinopril-hydrochlorothiazide 20-12.5 MG tablet Commonly known as:  PRINZIDE,ZESTORETIC     TAKE these medications   hydrochlorothiazide 12.5 MG capsule Commonly known as:  MICROZIDE Take 1 capsule (12.5 mg total) by mouth daily. Start taking on:  04/12/2017   lisinopril 20 MG tablet Commonly known as:  PRINIVIL,ZESTRIL Take 1 tablet (20 mg total) by mouth daily. Start taking on:  04/12/2017        Allergies:  Allergies  Allergen Reactions  . Fish Allergy Swelling and Rash    Throat swelling  . Other Swelling and Rash    Tree nuts cause throat swelling and rash Powder in gloves causes rash  .  Strawberry Flavor Swelling and Rash    Throat swelling  . Tomato Swelling and Rash    Throat swelling     Outstanding Labs/Studies   Recommend BMET at time of f/u  Duration of Discharge Encounter   Greater than 30 minutes including physician time.  Signed, Charlie Pitter PA-C 04/11/2017, 5:19 PM

## 2017-04-11 NOTE — Discharge Instructions (Signed)
Acute Pain, Adult  Acute pain is a type of pain that may last for just a few days or as long as six months. It is often related to an illness, injury, or medical procedure. Acute pain may be mild, moderate, or severe. It usually goes away once your injury has healed or you are no longer ill.  Pain can make it hard for you to do daily activities. It can cause anxiety and lead to other problems if left untreated. Treatment depends on the cause and severity of your acute pain.  Follow these instructions at home:   Check your pain level as told by your health care provider.   Take over-the-counter and prescription medicines only as told by your health care provider.   If you are taking prescription pain medicine:  ? Ask your health care provider about taking a stool softener or laxative to prevent constipation.  ? Do not stop taking the medicine suddenly. Talk to your health care provider about how and when to discontinue prescription pain medicine.  ? If your pain is severe, do not take more pills than instructed by your health care provider.  ? Do not take other over-the-counter pain medicines in addition to this medicine unless told by your health care provider.  ? Do not drive or operate heavy machinery while taking prescription pain medicine.   Apply ice or heat as told by your health care provider. These may reduce swelling and pain.   Ask your health care provider if other strategies such as distraction, relaxation, or physical therapies can help your pain.   Keep all follow-up visits as told by your health care provider. This is important.  Contact a health care provider if:   You have pain that is not controlled by medicine.   Your pain does not improve or gets worse.   You have side effects from pain medicines, such as vomitingor confusion.  Get help right away if:   You have severe pain.   You have trouble breathing.   You lose consciousness.   You have chest pain or pressure that lasts for more  than a few minutes. Along with the chest pain you may:  ? Have pain or discomfort in one or both arms, your back, neck, jaw, or stomach.  ? Have shortness of breath.  ? Break out in a cold sweat.  ? Feel nauseous.  ? Become light-headed.  These symptoms may represent a serious problem that is an emergency. Do not wait to see if the symptoms will go away. Get medical help right away. Call your local emergency services (911 in the [place] Do not drive yourself to the hospital.  This information is not intended to replace advice given to you by your health care provider. Make sure you discuss any questions you have with your health care provider.  Document Released: 10/21/2015 Document Revised: 03/14/2016 Document Reviewed: 10/21/2015  Elsevier Interactive Patient Education  2018 [employer]

## 2017-04-11 NOTE — Progress Notes (Signed)
Patient Name: Caleb Carter Date of Encounter: 04/11/2017  Primary Cardiologist: New- [facility] Problem List     Active Problems:   Chest pain     Subjective   No further chest pain since admission   Inpatient Medications    Scheduled Meds: . aspirin EC  81 mg Oral Daily  . atorvastatin  40 mg Oral q1800  . heparin  5,000 Units Subcutaneous Q8H  . lisinopril  10 mg Oral Daily   And  . hydrochlorothiazide  12.5 mg Oral Daily  . sodium chloride flush  3 mL Intravenous Q12H   Continuous Infusions: . sodium chloride     PRN Meds: sodium chloride, acetaminophen, nitroGLYCERIN, ondansetron (ZOFRAN) IV, sodium chloride flush   Vital Signs    Vitals:   04/10/17 2030 04/10/17 2100 04/10/17 2152 04/11/17 0500  BP: (!) 148/90 (!) 141/100 132/84 118/73  Pulse: 69 63 (!) 53 62  Resp: 14 15  14   Temp:   98.9 F (37.2 C) 98.5 F (36.9 C)  TempSrc:   Oral Oral  SpO2: 99% 100% 100% 100%  Weight:   223 lb 6.4 oz (101.3 kg) 223 lb 14.4 oz (101.6 kg)  Height:   5\' 10"  (1.778 m)     Intake/Output Summary (Last 24 hours) at 04/11/17 0726 Last data filed at 04/11/17 0459  Gross per 24 hour  Intake              240 ml  Output              700 ml  Net             -460 ml   Filed Weights   04/10/17 2152 04/11/17 0500  Weight: 223 lb 6.4 oz (101.3 kg) 223 lb 14.4 oz (101.6 kg)    Physical Exam   GEN: Well nourished, well developed, in no acute distress.  HEENT: Grossly normal.  Neck: Supple, no JVD, carotid bruits, or masses. Cardiac: RRR, no murmurs, rubs, or gallops. No clubbing, cyanosis, edema.  Radials/DP/PT 2+ and equal bilaterally.  Respiratory:  Respirations regular and unlabored, clear to auscultation bilaterally. GI: Soft, nontender, nondistended, BS + x 4. MS: no deformity or atrophy. Skin: warm and dry, no rash. Neuro:  Strength and sensation are intact. Psych: AAOx3.  Normal affect.  Labs    CBC  Recent Labs  04/10/17 1810  04/10/17 2155 04/11/17 0323  WBC 10.2 8.9 8.5  NEUTROABS 7.9*  --   --   HGB 13.1 12.3* 12.6*  HCT 38.5* 36.7* 37.8*  MCV 84.6 84.2 84.4  PLT 257 242 992   Basic Metabolic Panel  Recent Labs  04/10/17 1810 04/10/17 2155 04/11/17 0323  NA 140  --  140  K 3.7  --  3.4*  CL 106  --  105  CO2 26  --  27  GLUCOSE 88  --  75  BUN 9  --  8  CREATININE 1.23 1.21 1.24  CALCIUM 9.4  --  9.2   Liver Function Tests  Recent Labs  04/10/17 1810 04/11/17 0323  AST 25 20  ALT 22 18  ALKPHOS 69 66  BILITOT 0.7 0.8  PROT 6.8 6.2*  ALBUMIN 3.6 3.3*   No results for input(s): LIPASE, AMYLASE in the last 72 hours. Cardiac Enzymes  Recent Labs  04/10/17 2155 04/11/17 0323  TROPONINI <0.03 <0.03   BNP Invalid input(s): POCBNP D-Dimer  Recent Labs  04/10/17 1810  DDIMER <0.27  Hemoglobin A1C No results for input(s): HGBA1C in the last 72 hours. Fasting Lipid Panel  Recent Labs  04/11/17 0323  CHOL 143  HDL 43  LDLCALC 89  TRIG 54  CHOLHDL 3.3   Thyroid Function Tests  Recent Labs  04/10/17 2155  TSH 1.479    Telemetry    Sinus with some bradycardia into 40s ~ 4am - Personally Reviewed  ECG    Sinus brady HR 59 with early repol in V2-V3 and lead I and AVL  - Personally Reviewed  Radiology    Dg Chest 2 View  Result Date: 04/10/2017 CLINICAL DATA:  Chest pain for a while, now persistent. EXAM: CHEST  2 VIEW COMPARISON:  01/28/2015. FINDINGS: The heart size and mediastinal contours are within normal limits. Both lungs are clear. The visualized skeletal structures are unremarkable. Mild mid thoracic dextroconvex scoliosis similar to priors. IMPRESSION: No active cardiopulmonary disease. Electronically Signed   By: Staci Righter M.D.   On: 04/10/2017 18:52    Cardiac Studies   none  Patient Profile     Caleb Carter is a 46 y.o. male with a history of HTN, HLD, OSA on CPAP, tobacco abuse and family history of CAD who presented to [facility] ED last  night (04/11/17) with chest pain.  Assessment & Plan    Chest pain: With typical and atypical features. No objective signs of ischemia with negative troponin x2 and nonacute ECG. Given risk factors including hypertension, hyperlipidemia, tobacco abuse and family history admitted for observation. Also noted on telemetry some bradycardia with up to 3 second pause raising question for ischemia induced conduction abnormalities. He is currently NPO. Myovue ordered   Bradycardia with pause: 3 second pause yesterday on tele while in ER. Some bradycardia overnight.   HTN: BP well controlled currently  HLD: TC 143; TG 54; HDL 43; LDL 89  OSA: he is basically complaint with CPAP  Signed, Angelena Form, PA-C  04/11/2017, 7:26 AM   Patient examined chart reviewed. Hearing aids exam otherwise benign R/O atypical pain with risk factors Myovue ordered for this am ok to d/c if not ischemic  Jenkins Rouge

## 2017-04-11 NOTE — Progress Notes (Signed)
Myovue appeared normal to me with mild CE EF 53% Echo normal no RWMA;s EF normal Ambulating with no pain  Ok to d/c  Sharrell Ku PA to arrange 1 week f/u in office No work until after office visit  Jenkins Rouge

## 2017-04-11 NOTE — Progress Notes (Signed)
Echocardiogram 2D Echocardiogram has been performed.  Joelene Millin 04/11/2017, 2:49 PM

## 2017-04-12 LAB — HEMOGLOBIN A1C
Hgb A1c MFr Bld: 5.1 % (ref 4.8–5.6)
Mean Plasma Glucose: 100 mg/dL

## 2017-04-12 LAB — NM MYOCAR MULTI W/SPECT W/WALL MOTION / EF
CSEPPHR: 104 {beats}/min
Rest HR: 51 {beats}/min

## 2017-04-20 NOTE — Progress Notes (Signed)
Cardiology Office Note    Date:  04/21/2017   ID:  Caleb Carter, DOB 1971-05-17, MRN [id number]  PCP:  [facility]  Cardiologist: Dr. Debara Pickett  Chief Complaint  Patient presents with  . Hospitalization Follow-up    History of Present Illness:    Caleb Carter is a 46 y.o. male with past medical history of HTN, OSA (on CPAP), and tobacco use who presents to the office today for hospital follow-up.   He was recently admitted to [facility] from 6/22 - 04/11/2017 for evaluation of chest pain. He reported sudden-onset chest pain on the day of admission which occurred when walking into work with associated dyspnea and diaphoresis. D-dimer was negative and cyclic troponin values remained negative. Echo showed a preserved EF of 55-60% with no regional WMA. NST showed no evidence of ischemia or prior infarction, overall being a low-risk study. He was noted to have up to 3-second pauses on telemetry but was asymptomatic with this, therefore AV nodal blocking agents were avoided. He was hypertensive during admission, therefore Lisinopril was increased to 20mg  daily with plans to resume Lisinopril-HCTZ combo as an outpatient if BP remained well-controlled.   In talking with the patient today, he reports overall doing well since his recent hospitalization. He does report an episode of nausea with associated lightheadedness upon returning home from the hospital. His blood pressure was soft at 102/64 at that time. Since then, he has continued to check his blood pressure regularly and notes systolic readings in the low 100's to 130's. He denies any repeat episodes of nausea or lightheadedness.  Does note occasional episodes of chest discomfort which occurs when extending his right arm behind his body. No exertional chest discomfort. No dyspnea on exertion, orthopnea, PND, lower extremity edema, or palpitations.  He reports having a CPAP at home but is not able to use this due to  feeling like he is not getting enough oxygen at night. He hardly wears it and reports frequent snoring. His last sleep study was 5+ years ago according to the patient.   Past Medical History:  Diagnosis Date  . Chest pain    a. 03/2017: echo showing a preserved EF of 55-60% with no WMA. NST showing no reversible ischemia or prior infarction, overall a low-risk study.   . Family history of early CAD   . HOH (hard of hearing)    a. wears hearing aid  . Hypertension   . Tobacco abuse     No past surgical history on file.  Current Medications: Outpatient Medications Prior to Visit  Medication Sig Dispense Refill  . hydrochlorothiazide (MICROZIDE) 12.5 MG capsule Take 1 capsule (12.5 mg total) by mouth daily. 30 capsule 3  . lisinopril (PRINIVIL,ZESTRIL) 20 MG tablet Take 1 tablet (20 mg total) by mouth daily. 30 tablet 3   No facility-administered medications prior to visit.      Allergies:   Fish allergy; Other; Strawberry flavor; and Tomato   Social History   Social History  . Marital status: Single    Spouse name: N/A  . Number of children: N/A  . Years of education: N/A   Social History Main Topics  . Smoking status: Current Some Day Smoker  . Smokeless tobacco: Never Used  . Alcohol use Yes  . Drug use: Unknown  . Sexual activity: Not Asked   Other Topics Concern  . None   Social History Narrative  . None     Family History:  The patient's  family history includes Heart attack in his paternal grandmother; Heart attack (age of onset: 70) in his paternal uncle; Hypertension in his father and sister; Stroke in his father.   Review of Systems:   Please see the history of present illness.     General:  No chills, fever, night sweats or weight changes.  Cardiovascular:  No chest pain, dyspnea on exertion, edema, orthopnea, palpitations, paroxysmal nocturnal dyspnea. Dermatological: No rash, lesions/masses Respiratory: No cough, dyspnea Urologic: No hematuria,  dysuria Abdominal:   No nausea, vomiting, diarrhea, bright red blood per rectum, melena, or hematemesis Neurologic:  No visual changes, wkns, changes in mental status. Positive for dizziness.   All other systems reviewed and are otherwise negative except as noted above.   Physical Exam:    VS:  BP 127/77   Pulse 78   Ht 5\' 9"  (1.753 m)   Wt 224 lb (101.6 kg)   BMI 33.08 kg/m    General: Well developed, well nourished [place] male appearing in no acute distress. Head: Normocephalic, atraumatic, sclera non-icteric, no xanthomas, nares are without discharge.  Neck: No carotid bruits. JVD not elevated.  Lungs: Respirations regular and unlabored, without wheezes or rales.  Heart: Regular rate and rhythm. No S3 or S4.  No murmur, no rubs, or gallops appreciated. Abdomen: Soft, non-tender, non-distended with normoactive bowel sounds. No hepatomegaly. No rebound/guarding. No obvious abdominal masses. Msk:  Strength and tone appear normal for age. No joint deformities or effusions. Extremities: No clubbing or cyanosis. No lower extremity edema.  Distal pedal pulses are 2+ bilaterally. Neuro: Alert and oriented X 3. Moves all extremities spontaneously. No focal deficits noted. Psych:  Responds to questions appropriately with a normal affect. Skin: No rashes or lesions noted  Wt Readings from Last 3 Encounters:  04/21/17 224 lb (101.6 kg)  04/11/17 223 lb 14.4 oz (101.6 kg)     Studies/Labs Reviewed:   EKG:  EKG is not ordered today.    Recent Labs: 04/10/2017: TSH 1.479 04/11/2017: ALT 18; BUN 8; Creatinine, Ser 1.24; Hemoglobin 12.6; Platelets 243; Potassium 3.4; Sodium 140   Lipid Panel    Component Value Date/Time   CHOL 143 04/11/2017 0323   TRIG 54 04/11/2017 0323   HDL 43 04/11/2017 0323   CHOLHDL 3.3 04/11/2017 0323   VLDL 11 04/11/2017 0323   LDLCALC 89 04/11/2017 0323    Additional studies/ records that were reviewed today include:   Echocardiogram:  04/11/2017 Study Conclusions  - Left ventricle: The cavity size was normal. Wall thickness was   increased in a pattern of mild LVH. Systolic function was normal.   The estimated ejection fraction was in the range of 55% to 60%.   Wall motion was normal; there were no regional wall motion   abnormalities. Left ventricular diastolic function parameters   were normal. - Atrial septum: No defect or patent foramen ovale was identified.  NST: 04/12/2017  IMPRESSION: 1. No reversible ischemia or infarction.  2. Normal left ventricular wall motion.  3. Left ventricular ejection fraction 53%  4. Non invasive risk stratification*: Low   Assessment:    1. Pleuritic chest pain   2. Essential hypertension   3. Encounter for therapeutic drug monitoring   4. OSA (obstructive sleep apnea)   5. Snoring      Plan:   In order of problems listed above:  1. Pleuritic Chest Pain  - was recently admitted to [facility] from 6/22 - 04/11/2017 for evaluation of chest pain  with associated dyspnea and diaphoresis. D-dimer was negative and cyclic troponin values remained negative. Echo showed a preserved EF of 55-60% with no regional WMA. NST showed no evidence of ischemia or prior infarction, overall being a low-risk study.  - he denies any repeat episodes of dyspnea or exertional chest pain. Has experienced a discomfort along his left pectoral region when extending his right arm behind his body, most consistent with a MSK etiology.  - no further cardiac testing indicated at this time.   2. HTN - BP is well-controlled at 127/77 during today's visit. - he initially experienced lightheadedness with the dose adjustment but reports this has resolved.  - will continue HCTZ 12.5mg  daily and Lisinopril 20mg  daily. If he has recurrent episodes of dizziness, would reduce Lisinopril dosing prior to starting combination therapy. He will continue to check his BP regularly and report back with his results.    3. OSA/ Snoring - he does not use his CPAP due to feeling like he is not getting enough oxygen at night. Reports frequent snoring and daytime somnolence.  - last sleep study was 5+ years ago according to the patient.  - will order a repeat sleep study at this time.    Medication Adjustments/Labs and Tests Ordered: Current medicines are reviewed at length with the patient today.  Concerns regarding medicines are outlined above.  Medication changes, Labs and Tests ordered today are listed in the Patient Instructions below. Patient Instructions  Medication Instructions:  Your physician recommends that you continue on your current medications as directed. Please refer to the Current Medication list given to you today.  Labwork: BMET TODAY   Testing/Procedures: Your physician has recommended that you have a sleep study. This test records several body functions during sleep, including: brain activity, eye movement, oxygen and carbon dioxide blood levels, heart rate and rhythm, breathing rate and rhythm, the flow of air through your mouth and nose, snoring, body muscle movements, and chest and belly movement.  Follow-Up: Your physician wants you to follow-up in: [place] will receive a reminder letter in the mail two months in advance. If you don't receive a letter, please call our office to schedule the follow-up appointment.  Any Other Special Instructions Will Be Listed Below (If Applicable). CONTINUE TO MONITOR YOUR BLOOD PRESSURE AT HOME   If you need a refill on your cardiac medications before your next appointment, please call your pharmacy.   Signed, Erma Heritage, PA-C  04/21/2017 2:16 PM    [facility] [place], [place], [place]  [address] Phone: [phone]; Fax: [phone]  [address], [place],  [address] Phone: [phone]

## 2017-04-21 ENCOUNTER — Ambulatory Visit (INDEPENDENT_AMBULATORY_CARE_PROVIDER_SITE_OTHER): Payer: Federal, State, Local not specified - PPO | Admitting: Student

## 2017-04-21 ENCOUNTER — Encounter: Payer: Self-pay | Admitting: *Deleted

## 2017-04-21 ENCOUNTER — Encounter: Payer: Self-pay | Admitting: Student

## 2017-04-21 VITALS — BP 127/77 | HR 78 | Ht 69.0 in | Wt 224.0 lb

## 2017-04-21 DIAGNOSIS — R0781 Pleurodynia: Secondary | ICD-10-CM | POA: Diagnosis not present

## 2017-04-21 DIAGNOSIS — I10 Essential (primary) hypertension: Secondary | ICD-10-CM

## 2017-04-21 DIAGNOSIS — R0683 Snoring: Secondary | ICD-10-CM

## 2017-04-21 DIAGNOSIS — Z5181 Encounter for therapeutic drug level monitoring: Secondary | ICD-10-CM | POA: Diagnosis not present

## 2017-04-21 DIAGNOSIS — G4733 Obstructive sleep apnea (adult) (pediatric): Secondary | ICD-10-CM | POA: Diagnosis not present

## 2017-04-21 NOTE — Patient Instructions (Signed)
Medication Instructions:  Your physician recommends that you continue on your current medications as directed. Please refer to the Current Medication list given to you today.  Labwork: BMET TODAY   Testing/Procedures: Your physician has recommended that you have a sleep study. This test records several body functions during sleep, including: brain activity, eye movement, oxygen and carbon dioxide blood levels, heart rate and rhythm, breathing rate and rhythm, the flow of air through your mouth and nose, snoring, body muscle movements, and chest and belly movement.  Follow-Up: Your physician wants you to follow-up in: [place] will receive a reminder letter in the mail two months in advance. If you don't receive a letter, please call our office to schedule the follow-up appointment.  Any Other Special Instructions Will Be Listed Below (If Applicable). CONTINUE TO MONITOR YOUR BLOOD PRESSURE AT HOME   If you need a refill on your cardiac medications before your next appointment, please call your pharmacy.

## 2017-04-22 LAB — BASIC METABOLIC PANEL
BUN/Creatinine Ratio: 13 (ref 9–20)
BUN: 16 mg/dL (ref 6–24)
CALCIUM: 10 mg/dL (ref 8.7–10.2)
CO2: 24 mmol/L (ref 20–29)
CREATININE: 1.28 mg/dL — AB (ref 0.76–1.27)
Chloride: 99 mmol/L (ref 96–106)
GFR calc Af Amer: 78 mL/min/{1.73_m2} (ref >59)
GFR, EST NON AFRICAN AMERICAN: 67 mL/min/{1.73_m2} (ref >59)
GLUCOSE: 92 mg/dL (ref 65–99)
POTASSIUM: 4.7 mmol/L (ref 3.5–5.2)
Sodium: 137 mmol/L (ref 134–144)

## 2017-06-03 ENCOUNTER — Ambulatory Visit (HOSPITAL_BASED_OUTPATIENT_CLINIC_OR_DEPARTMENT_OTHER): Payer: Federal, State, Local not specified - PPO

## 2017-06-19 ENCOUNTER — Ambulatory Visit: Payer: Self-pay | Admitting: Student

## 2017-07-29 ENCOUNTER — Encounter (HOSPITAL_BASED_OUTPATIENT_CLINIC_OR_DEPARTMENT_OTHER): Payer: Federal, State, Local not specified - PPO

## 2017-07-30 DIAGNOSIS — F411 Generalized anxiety disorder: Secondary | ICD-10-CM | POA: Diagnosis not present

## 2017-07-30 DIAGNOSIS — F321 Major depressive disorder, single episode, moderate: Secondary | ICD-10-CM | POA: Diagnosis not present

## 2017-08-12 DIAGNOSIS — F411 Generalized anxiety disorder: Secondary | ICD-10-CM | POA: Diagnosis not present

## 2017-08-12 DIAGNOSIS — F321 Major depressive disorder, single episode, moderate: Secondary | ICD-10-CM | POA: Diagnosis not present

## 2017-08-18 NOTE — Progress Notes (Signed)
Cardiology Office Note    Date:  08/19/2017   ID:  Caleb Carter, DOB 11/01/1970, MRN [id number]  PCP:  Leamon Arnt, MD  Cardiologist: Dr. Debara Pickett   Chief Complaint  Patient presents with  . Chest Pain    History of Present Illness:    Caleb Carter is a 46 y.o. male with past medical history of HTN, OSA (on CPAP), and tobacco use who presents to the office today for evaluation of chest pain.   He was admitted to [facility] in 03/2017 for evaluation of chest pain with a NST showing no evidence of ischemia or prior infarction, overall being a low-risk study. An echocardiogram was obtained and showed an EF of 55-60% with no regional WMA. At the time of his follow-up appointment on 04/21/2017, he reported only mild episodes of chest pain which would occur with extending his right arm behind his body. Denied any exertional symptoms. Overall, this was felt to be atypical for a cardiac etiology and with his recent NST, no further testing was pursued.   In talking with the patient today, he reports having episodes of dyspnea on exertion and chest pain over the past several months. He notes dyspnea when climbing the stairs in his home which is new over the past 2-3 months. Denies any chest pain with this but does experience chest tightness when pushing large carts at work. This pain typically lasts for 5-10 minutes then spontaneously resolves.   He denies any recent orthopnea, PND, or lower extremity edema. Weights have been stable on his home scales. He continues to smoke but has reduced his use to 2-3 cigarettes per day. Reports being under increased social stress due to marital issues and feels like this sometimes contributes to his symptoms.    Past Medical History:  Diagnosis Date  . Chest pain    a. 03/2017: echo showing a preserved EF of 55-60% with no WMA. NST showing no reversible ischemia or prior infarction, overall a low-risk study.   . Family history of early CAD   . HOH  (hard of hearing)    a. wears hearing aid  . Hypertension   . Tobacco abuse     No past surgical history on file.  Current Medications: Outpatient Medications Prior to Visit  Medication Sig Dispense Refill  . hydrochlorothiazide (MICROZIDE) 12.5 MG capsule Take 1 capsule (12.5 mg total) by mouth daily. 30 capsule 3  . lisinopril (PRINIVIL,ZESTRIL) 20 MG tablet Take 1 tablet (20 mg total) by mouth daily. 30 tablet 3   No facility-administered medications prior to visit.      Allergies:   Fish allergy; Other; Strawberry flavor; and Tomato   Social History   Social History  . Marital status: Single    Spouse name: N/A  . Number of children: N/A  . Years of education: N/A   Social History Main Topics  . Smoking status: Current Some Day Smoker  . Smokeless tobacco: Never Used  . Alcohol use Yes  . Drug use: Unknown  . Sexual activity: Not Asked   Other Topics Concern  . None   Social History Narrative  . None     Family History:  The patient's family history includes Heart attack in his paternal grandmother; Heart attack (age of onset: 28) in his paternal uncle; Hypertension in his father and sister; Stroke in his father.   Review of Systems:   Please see the history of present illness.     General:  No  chills, fever, night sweats or weight changes.  Cardiovascular:  No edema, orthopnea, palpitations, paroxysmal nocturnal dyspnea. Positive for chest pain and dyspnea on exertion.  Dermatological: No rash, lesions/masses Respiratory: No cough, dyspnea Urologic: No hematuria, dysuria Abdominal:   No nausea, vomiting, diarrhea, bright red blood per rectum, melena, or hematemesis Neurologic:  No visual changes, wkns, changes in mental status. All other systems reviewed and are otherwise negative except as noted above.   Physical Exam:    VS:  BP 132/90   Pulse 75   Ht 5\' 9"  (1.753 m)   Wt 221 lb (100.2 kg)   BMI 32.64 kg/m    General: Well developed, well  nourished [place] American male appearing in no acute distress. Head: Normocephalic, atraumatic, sclera non-icteric, no xanthomas, nares are without discharge.  Neck: No carotid bruits. JVD not elevated.  Lungs: Respirations regular and unlabored, without wheezes or rales.  Heart: Regular rate and rhythm. No S3 or S4.  No murmur, no rubs, or gallops appreciated. Abdomen: Soft, non-tender, non-distended with normoactive bowel sounds. No hepatomegaly. No rebound/guarding. No obvious abdominal masses. Msk:  Strength and tone appear normal for age. No joint deformities or effusions. Extremities: No clubbing or cyanosis. No lower extremity  edema.  Distal pedal pulses are 2+ bilaterally. Neuro: Alert and oriented X 3. Moves all extremities spontaneously. No focal deficits noted. Psych:  Responds to questions appropriately with a normal affect. Skin: No rashes or lesions noted  Wt Readings from Last 3 Encounters:  08/19/17 221 lb (100.2 kg)  04/21/17 224 lb (101.6 kg)  04/11/17 223 lb 14.4 oz (101.6 kg)     Studies/Labs Reviewed:   EKG:  EKG is ordered today.  The ekg ordered today demonstrates NSR, HR 75, with no acute ST or T-wave changes when compared to prior tracings.   Recent Labs: 04/10/2017: TSH 1.479 04/11/2017: ALT 18; Hemoglobin 12.6; Platelets 243 04/21/2017: BUN 16; Creatinine, Ser 1.28; Potassium 4.7; Sodium 137   Lipid Panel    Component Value Date/Time   CHOL 143 04/11/2017 0323   TRIG 54 04/11/2017 0323   HDL 43 04/11/2017 0323   CHOLHDL 3.3 04/11/2017 0323   VLDL 11 04/11/2017 0323   LDLCALC 89 04/11/2017 0323    Additional studies/ records that were reviewed today include:   Echocardiogram: 03/2017 Study Conclusions  - Left ventricle: The cavity size was normal. Wall thickness was   increased in a pattern of mild LVH. Systolic function was normal.   The estimated ejection fraction was in the range of 55% to 60%.   Wall motion was normal; there were no regional  wall motion   abnormalities. Left ventricular diastolic function parameters   were normal. - Atrial septum: No defect or patent foramen ovale was identified.   NST: 03/2017 IMPRESSION: 1. No reversible ischemia or infarction.  2. Normal left ventricular wall motion.  3. Left ventricular ejection fraction 53%  4. Non invasive risk stratification*: Low   Assessment:    1. Atypical chest pain   2. Dyspnea on exertion   3. Essential hypertension   4. OSA (obstructive sleep apnea)   5. Tobacco abuse      Plan:   In order of problems listed above:  1. Atypical Chest Pain/ Dyspnea on Exertion - the patient was initially hospitalized in 03/2017 for evaluation of chest pain with a NST showing no evidence of ischemia or prior infarction and echocardiogram with an EF of 55-60% with no regional WMA.  - he reports  having episodes of dyspnea on exertion for the past 2-3 months which now occurs when climbing stairs at his home but not always. Has experienced episodes of chest pressure when pushing large carts at his work. Feels like the episodes have increased since dealing with increased social stressors.  - his EKG today shows no acute ST or T-wave changes. He has no known history of CAD. Will obtain a Coronary CT for more definitive evaluation and to rule-out significant stenosis.   2. HTN - BP is at 132/90 during today's visit. Reports BP is usually in the 120's/80's when checked at home. - continue Lisinopril 20mg  daily and HCTZ 12.5mg  daily (patient prefers separate tablets in place of a combination pill).   3. OSA - continue CPAP.  4. Tobacco Use - he has reduced his use to 2-3 cigarettes per day. - complete cessation advised.    Medication Adjustments/Labs and Tests Ordered: Current medicines are reviewed at length with the patient today.  Concerns regarding medicines are outlined above.  Medication changes, Labs and Tests ordered today are listed in the Patient  Instructions below. Patient Instructions  Medication Instructions:  Your physician recommends that you continue on your current medications as directed. Please refer to the Current Medication list given to you today.  Labwork: Your physician recommends that you return for lab work in: [place] -BMET  Testing/Procedures:. Please arrive at the [facility] main entrance of [facility] at ___:___ AM/PM (30-45 minutes prior to test start time)  [facility]  [address]  [place], [place] [address]  [phone]  Proceed to the [facility] Radiology Department (First Floor).  Please follow these instructions carefully (unless otherwise directed):  Hold all erectile dysfunction medications at least 48 hours prior to test.  On the Night Before the Test:  * Drink plenty of water.  * Do not consume any caffeinated/decaffeinated beverages or chocolate 12 hours prior to your test.  * Do not take any antihistamines 12 hours prior to your test.  * If the patient has contrast allergy:  On the Day of the Test:  * Drink plenty of water. Do not drink any water within one hour of the test.  * Do not eat any food 4 hours prior to the test.  * You may take your regular medications prior to the test.  * IF NOT ON A BETA BLOCKER - Take 50 mg of lopressor (metoprolol) one hour before the test.  * HOLD Hydrocholorothiazide morning of the test.  After the Test:  * Drink plenty of water.  * After receiving IV contrast, you may experience a mild flushed feeling. This is normal.  * On occasion, you may experience a mild rash up to 24 hours after the test. This is not dangerous. If this occurs, you can take Benadryl 25 mg and increase your fluid intake.  * If you experience trouble breathing, this can be serious. If it is severe call 911 IMMEDIATELY. If it is mild, please call our office.    Follow-Up: Your physician wants you to follow-up in: 6 months with Dr Debara Pickett. You will receive a  reminder letter in the mail two months in advance. If you don't receive a letter, please call our office to schedule the follow-up appointment.  Any Other Special Instructions Will Be Listed Below (If Applicable).  If you need a refill on your cardiac medications before your next appointment, please call your pharmacy.     Signed, Fransisco Hertz  Delano Metz  08/19/2017 8:38 PM    [place], [place], [place]  [address] Phone: [phone]; Fax: [phone]  [address] [place], [place] [address] Phone: [phone]

## 2017-08-19 ENCOUNTER — Ambulatory Visit (INDEPENDENT_AMBULATORY_CARE_PROVIDER_SITE_OTHER): Payer: Federal, State, Local not specified - PPO | Admitting: Student

## 2017-08-19 ENCOUNTER — Encounter: Payer: Self-pay | Admitting: Student

## 2017-08-19 VITALS — BP 132/90 | HR 75 | Ht 69.0 in | Wt 221.0 lb

## 2017-08-19 DIAGNOSIS — R0609 Other forms of dyspnea: Secondary | ICD-10-CM

## 2017-08-19 DIAGNOSIS — R0789 Other chest pain: Secondary | ICD-10-CM

## 2017-08-19 DIAGNOSIS — Z720 Tobacco use: Secondary | ICD-10-CM

## 2017-08-19 DIAGNOSIS — I10 Essential (primary) hypertension: Secondary | ICD-10-CM | POA: Diagnosis not present

## 2017-08-19 DIAGNOSIS — G4733 Obstructive sleep apnea (adult) (pediatric): Secondary | ICD-10-CM

## 2017-08-19 MED ORDER — METOPROLOL TARTRATE 50 MG PO TABS: ORAL_TABLET | ORAL | 0 refills | Status: DC

## 2017-08-19 NOTE — Patient Instructions (Signed)
Medication Instructions:  Your physician recommends that you continue on your current medications as directed. Please refer to the Current Medication list given to you today.  Labwork: Your physician recommends that you return for lab work in: [place] -BMET  Testing/Procedures:. Please arrive at the [facility] main entrance of [facility] at ___:___ AM/PM (30-45 minutes prior to test start time)  [facility]  [address]  [place], [place] [address]  [phone]  Proceed to the [facility] Radiology Department (First Floor).  Please follow these instructions carefully (unless otherwise directed):  Hold all erectile dysfunction medications at least 48 hours prior to test.  On the Night Before the Test:  * Drink plenty of water.  * Do not consume any caffeinated/decaffeinated beverages or chocolate 12 hours prior to your test.  * Do not take any antihistamines 12 hours prior to your test.  * If the patient has contrast allergy:  On the Day of the Test:  * Drink plenty of water. Do not drink any water within one hour of the test.  * Do not eat any food 4 hours prior to the test.  * You may take your regular medications prior to the test.  * IF NOT ON A BETA BLOCKER - Take 50 mg of lopressor (metoprolol) one hour before the test.  * HOLD Hydrocholorothiazide morning of the test.  After the Test:  * Drink plenty of water.  * After receiving IV contrast, you may experience a mild flushed feeling. This is normal.  * On occasion, you may experience a mild rash up to 24 hours after the test. This is not dangerous. If this occurs, you can take Benadryl 25 mg and increase your fluid intake.  * If you experience trouble breathing, this can be serious. If it is severe call 911 IMMEDIATELY. If it is mild, please call our office.    Follow-Up: Your physician wants you to follow-up in: 6 months with Dr Debara Pickett. You will receive a reminder letter in the mail two months in  advance. If you don't receive a letter, please call our office to schedule the follow-up appointment.  Any Other Special Instructions Will Be Listed Below (If Applicable).  If you need a refill on your cardiac medications before your next appointment, please call your pharmacy.

## 2017-08-20 LAB — BASIC METABOLIC PANEL
BUN/Creatinine Ratio: 15 (ref 9–20)
BUN: 18 mg/dL (ref 6–24)
CALCIUM: 9.9 mg/dL (ref 8.7–10.2)
CO2: 24 mmol/L (ref 20–29)
Chloride: 103 mmol/L (ref 96–106)
Creatinine, Ser: 1.24 mg/dL (ref 0.76–1.27)
GFR calc Af Amer: 80 mL/min/{1.73_m2} (ref >59)
GFR calc non Af Amer: 69 mL/min/{1.73_m2} (ref >59)
GLUCOSE: 76 mg/dL (ref 65–99)
POTASSIUM: 4.5 mmol/L (ref 3.5–5.2)
SODIUM: 139 mmol/L (ref 134–144)

## 2017-08-26 DIAGNOSIS — F321 Major depressive disorder, single episode, moderate: Secondary | ICD-10-CM | POA: Diagnosis not present

## 2017-08-26 DIAGNOSIS — F411 Generalized anxiety disorder: Secondary | ICD-10-CM | POA: Diagnosis not present

## 2017-09-02 DIAGNOSIS — F411 Generalized anxiety disorder: Secondary | ICD-10-CM | POA: Diagnosis not present

## 2017-09-02 DIAGNOSIS — F321 Major depressive disorder, single episode, moderate: Secondary | ICD-10-CM | POA: Diagnosis not present

## 2017-09-16 ENCOUNTER — Ambulatory Visit (HOSPITAL_COMMUNITY)
Admission: RE | Admit: 2017-09-16 | Discharge: 2017-09-16 | Disposition: A | Discharge status: Home / Self Care | Payer: Federal, State, Local not specified - PPO | Source: Ambulatory Visit | Attending: Student | Admitting: Student

## 2017-09-16 ENCOUNTER — Other Ambulatory Visit: Payer: Self-pay | Admitting: Physician Assistant

## 2017-09-16 ENCOUNTER — Ambulatory Visit (HOSPITAL_COMMUNITY): Admission: RE | Admit: 2017-09-16 | Payer: Federal, State, Local not specified - PPO | Source: Ambulatory Visit

## 2017-09-16 DIAGNOSIS — R079 Chest pain, unspecified: Secondary | ICD-10-CM | POA: Diagnosis not present

## 2017-09-16 DIAGNOSIS — R0609 Other forms of dyspnea: Secondary | ICD-10-CM | POA: Diagnosis not present

## 2017-09-16 DIAGNOSIS — R0789 Other chest pain: Secondary | ICD-10-CM | POA: Insufficient documentation

## 2017-09-16 MED ORDER — NITROGLYCERIN 0.4 MG SL SUBL
0.4000 mg | SUBLINGUAL_TABLET | Freq: Once | SUBLINGUAL | Status: AC
Start: 2017-09-16 — End: 2017-09-16
  Administered 2017-09-16: 0.4 mg via SUBLINGUAL

## 2017-09-16 MED ORDER — NITROGLYCERIN 0.4 MG SL SUBL
SUBLINGUAL_TABLET | SUBLINGUAL | Status: AC
  Filled 2017-09-16: qty 1

## 2017-09-16 MED ORDER — METOPROLOL TARTRATE 5 MG/5ML IV SOLN
5.0000 mg | INTRAVENOUS | Status: DC | PRN
  Administered 2017-09-16 (×2): 5 mg via INTRAVENOUS

## 2017-09-16 MED ORDER — IOPAMIDOL (ISOVUE-370) INJECTION 76%
INTRAVENOUS | Status: AC
  Filled 2017-09-16: qty 100

## 2017-09-16 MED ORDER — METOPROLOL TARTRATE 5 MG/5ML IV SOLN
INTRAVENOUS | Status: AC
  Filled 2017-09-16: qty 15

## 2017-09-16 NOTE — Progress Notes (Signed)
CT scan completed. Tolerated well. D/C home awake and alert. In no distress. 

## 2017-09-17 NOTE — Telephone Encounter (Signed)
Rx(s) sent to pharmacy electronically.  

## 2017-09-18 ENCOUNTER — Other Ambulatory Visit: Payer: Self-pay | Admitting: *Deleted

## 2017-09-18 MED ORDER — HYDROCHLOROTHIAZIDE 12.5 MG PO CAPS: 12.5000 mg | ORAL_CAPSULE | Freq: Every day | ORAL | 1 refills | Status: DC

## 2017-09-18 MED ORDER — LISINOPRIL 20 MG PO TABS: 20.0000 mg | ORAL_TABLET | Freq: Every day | ORAL | 1 refills | Status: DC

## 2017-10-07 DIAGNOSIS — F321 Major depressive disorder, single episode, moderate: Secondary | ICD-10-CM | POA: Diagnosis not present

## 2017-10-07 DIAGNOSIS — F411 Generalized anxiety disorder: Secondary | ICD-10-CM | POA: Diagnosis not present

## 2017-10-08 ENCOUNTER — Other Ambulatory Visit: Payer: Self-pay | Admitting: Pediatrics

## 2017-10-08 NOTE — Telephone Encounter (Deleted)
Patient requesting refill on Zofran.  I do not see that we have ever prescribed this for her. She is adimate that we have.  Please advise

## 2017-10-22 DIAGNOSIS — F321 Major depressive disorder, single episode, moderate: Secondary | ICD-10-CM | POA: Diagnosis not present

## 2017-10-22 DIAGNOSIS — F411 Generalized anxiety disorder: Secondary | ICD-10-CM | POA: Diagnosis not present

## 2017-10-28 DIAGNOSIS — F4323 Adjustment disorder with mixed anxiety and depressed mood: Secondary | ICD-10-CM | POA: Diagnosis not present

## 2017-10-28 DIAGNOSIS — F321 Major depressive disorder, single episode, moderate: Secondary | ICD-10-CM | POA: Diagnosis not present

## 2017-10-28 DIAGNOSIS — F411 Generalized anxiety disorder: Secondary | ICD-10-CM | POA: Diagnosis not present

## 2017-11-02 DIAGNOSIS — F4323 Adjustment disorder with mixed anxiety and depressed mood: Secondary | ICD-10-CM | POA: Diagnosis not present

## 2017-11-25 DIAGNOSIS — F4323 Adjustment disorder with mixed anxiety and depressed mood: Secondary | ICD-10-CM | POA: Diagnosis not present

## 2017-11-25 DIAGNOSIS — F330 Major depressive disorder, recurrent, mild: Secondary | ICD-10-CM | POA: Diagnosis not present

## 2017-11-25 DIAGNOSIS — F411 Generalized anxiety disorder: Secondary | ICD-10-CM | POA: Diagnosis not present

## 2017-11-25 DIAGNOSIS — F4310 Post-traumatic stress disorder, unspecified: Secondary | ICD-10-CM | POA: Diagnosis not present

## 2017-12-10 DIAGNOSIS — I10 Essential (primary) hypertension: Secondary | ICD-10-CM | POA: Diagnosis not present

## 2017-12-10 DIAGNOSIS — R42 Dizziness and giddiness: Secondary | ICD-10-CM | POA: Diagnosis not present

## 2017-12-10 DIAGNOSIS — R194 Change in bowel habit: Secondary | ICD-10-CM | POA: Diagnosis not present

## 2017-12-10 DIAGNOSIS — Z1389 Encounter for screening for other disorder: Secondary | ICD-10-CM | POA: Diagnosis not present

## 2017-12-10 DIAGNOSIS — Z720 Tobacco use: Secondary | ICD-10-CM | POA: Diagnosis not present

## 2018-01-04 ENCOUNTER — Encounter: Payer: Self-pay | Admitting: Gastroenterology

## 2018-01-20 DIAGNOSIS — G4730 Sleep apnea, unspecified: Secondary | ICD-10-CM | POA: Diagnosis not present

## 2018-01-25 ENCOUNTER — Encounter: Payer: Self-pay | Admitting: Gastroenterology

## 2018-01-25 ENCOUNTER — Ambulatory Visit (INDEPENDENT_AMBULATORY_CARE_PROVIDER_SITE_OTHER): Payer: Federal, State, Local not specified - PPO | Admitting: Gastroenterology

## 2018-01-25 VITALS — BP 114/80 | HR 65 | Ht 69.0 in | Wt 225.0 lb

## 2018-01-25 DIAGNOSIS — R194 Change in bowel habit: Secondary | ICD-10-CM | POA: Diagnosis not present

## 2018-01-25 DIAGNOSIS — Z1211 Encounter for screening for malignant neoplasm of colon: Secondary | ICD-10-CM

## 2018-01-25 MED ORDER — NA SULFATE-K SULFATE-MG SULF 17.5-3.13-1.6 GM/177ML PO SOLN: ORAL | 0 refills | Status: DC

## 2018-01-25 NOTE — Progress Notes (Signed)
01/25/2018 Caleb Carter [id number] [birth year]   HISTORY OF PRESENT ILLNESS:  This is a pleasant 47 year old male who is new to our office.  He presents here today in order to discuss possible colonoscopy.  He says that his last colonoscopy was more than 9 years ago in [place].  At that time was performed for complaints of rectal bleeding, which he was told was due to hemorrhoids.  He says that recently over the past 4 months he has had issues with alternating bowel habits.  He says that sometimes he will go 2 or 3 days without a bowel movement and then some days he has a lot of loose stools.  He says that his stools have been dark in color, but not black.  He has been eating a lot of greens since his wife had gastric bypass surgery they have been eating much healthier.  He tells me that his stools were checked for blood and were negative, but we do not have those records.  He says that he last had labs as well, but he cannot recall the name of his PCP currently and it is not in the system.  He also reports a lot of increase in flatulence as well.   Past Medical History:  Diagnosis Date  . Chest pain    a. 03/2017: echo showing a preserved EF of 55-60% with no WMA. NST showing no reversible ischemia or prior infarction, overall a low-risk study.   . Family history of early CAD   . HOH (hard of hearing)    a. wears hearing aid  . Hypertension   . Tobacco abuse    History reviewed. No pertinent surgical history.  reports that he has been smoking.  He has never used smokeless tobacco. He reports that he drinks alcohol. His drug history is not on file. family history includes Heart attack in his paternal grandmother; Heart attack (age of onset: 4) in his paternal uncle; Hypertension in his father and sister; Stroke in his father. Allergies  Allergen Reactions  . Fish Allergy Swelling and Rash    Throat swelling  . Other Swelling and Rash    Tree nuts cause throat swelling and  rash Powder in gloves causes rash  . Strawberry Flavor Swelling and Rash    Throat swelling  . Tomato Swelling and Rash    Throat swelling      Outpatient Encounter Medications as of 01/25/2018  Medication Sig  . hydrochlorothiazide (MICROZIDE) 12.5 MG capsule Take 1 capsule (12.5 mg total) by mouth daily.  Marland Kitchen lisinopril (PRINIVIL,ZESTRIL) 20 MG tablet Take 1 tablet (20 mg total) by mouth daily.   No facility-administered encounter medications on file as of 01/25/2018.      REVIEW OF SYSTEMS  : All other systems reviewed and negative except where noted in the History of Present Illness.   PHYSICAL EXAM: BP 114/80   Pulse 65   Ht 5\' 9"  (1.753 m)   Wt 225 lb (102.1 kg)   BMI 33.23 kg/m  General: Well developed black male in no acute distress Head: Normocephalic and atraumatic Eyes:  Sclerae anicteric, conjunctiva pink. Ears: Normal auditory acuity Lungs: Clear throughout to auscultation; no increased WOB. Heart: Regular rate and rhythm; no M/R/G. Abdomen: Soft, non-distended.  BS present.  Mild mid to lower abdominal TTP.Marland Kitchen Rectal:  Will be done at the time of colonoscopy.   Musculoskeletal: Symmetrical with no gross deformities  Skin: No lesions on visible extremities Extremities: No edema  Neurological: Alert oriented x 4, grossly non-focal Psychological:  Alert and cooperative. Normal mood and affect  ASSESSMENT AND PLAN: *Screening colonoscopy:  Will schedule with Dr. Fuller Plan.  The risks, benefits, and alternatives to colonoscopy were discussed with the patient and he consents to proceed.  *Change in bowel habits:  Alternating mild constipation and loose stools:  Will try daily Benefiber or Citrucel powder.   CC:  No ref. provider found

## 2018-01-25 NOTE — Patient Instructions (Signed)
If you are age 47 or older, your body mass index should be between 23-30. Your Body mass index is 33.23 kg/m. If this is out of the aforementioned range listed, please consider follow up with your Primary Care Provider.  If you are age 67 or younger, your body mass index should be between 19-25. Your Body mass index is 33.23 kg/m. If this is out of the aformentioned range listed, please consider follow up with your Primary Care Provider.   You have been scheduled for a colonoscopy. Please follow written instructions given to you at your visit today.  Please pick up your prep supplies at the pharmacy within the next 1-3 days. If you use inhalers (even only as needed), please bring them with you on the day of your procedure. Your physician has requested that you go to www.startemmi.com and enter the access code given to you at your visit today. This web site gives a general overview about your procedure. However, you should still follow specific instructions given to you by our office regarding your preparation for the procedure.  We have sent the following medications to your pharmacy for you to pick up at your convenience: [employer] or Citrucel powder daily.  Thank you for choosing me and [facility].  Alonza Bogus, PA-C

## 2018-01-26 NOTE — Progress Notes (Signed)
Reviewed and agree with management plan.  Juanda Luba T. Nyimah Shadduck, MD FACG 

## 2018-01-27 ENCOUNTER — Telehealth: Payer: Self-pay | Admitting: Gastroenterology

## 2018-01-28 NOTE — Telephone Encounter (Signed)
Informed patient we have a free sample of Suprep if he can come by the office to pick it up. Patient states he will come today to pick it up.

## 2018-03-16 ENCOUNTER — Other Ambulatory Visit: Payer: Self-pay

## 2018-03-16 ENCOUNTER — Encounter: Payer: Self-pay | Admitting: Gastroenterology

## 2018-03-16 ENCOUNTER — Ambulatory Visit (AMBULATORY_SURGERY_CENTER): Payer: Federal, State, Local not specified - PPO | Admitting: Gastroenterology

## 2018-03-16 VITALS — BP 122/81 | HR 69 | Temp 99.5°F | Resp 14 | Ht 69.0 in | Wt 225.0 lb

## 2018-03-16 DIAGNOSIS — K621 Rectal polyp: Secondary | ICD-10-CM

## 2018-03-16 DIAGNOSIS — D128 Benign neoplasm of rectum: Secondary | ICD-10-CM

## 2018-03-16 DIAGNOSIS — D124 Benign neoplasm of descending colon: Secondary | ICD-10-CM

## 2018-03-16 DIAGNOSIS — R194 Change in bowel habit: Secondary | ICD-10-CM | POA: Diagnosis not present

## 2018-03-16 DIAGNOSIS — Z1211 Encounter for screening for malignant neoplasm of colon: Secondary | ICD-10-CM | POA: Diagnosis present

## 2018-03-16 MED ORDER — SODIUM CHLORIDE 0.9 % IV SOLN: 500.0000 mL | Freq: Once | INTRAVENOUS | Status: AC

## 2018-03-16 NOTE — Progress Notes (Signed)
Called to room to assist during endoscopic procedure.  Patient ID and intended procedure confirmed with present staff. Received instructions for my participation in the procedure from the performing physician.  

## 2018-03-16 NOTE — Patient Instructions (Signed)
Handouts given : Polyps, Diverticulosis and Hemorrhoids.   YOU HAD AN ENDOSCOPIC PROCEDURE TODAY AT THE [facility]:   Refer to the procedure report that was given to you for any specific questions about what was found during the examination.  If the procedure report does not answer your questions, please call your gastroenterologist to clarify.  If you requested that your care partner not be given the details of your procedure findings, then the procedure report has been included in a sealed envelope for you to review at your convenience later.  YOU SHOULD EXPECT: Some feelings of bloating in the abdomen. Passage of more gas than usual.  Walking can help get rid of the air that was put into your GI tract during the procedure and reduce the bloating. If you had a lower endoscopy (such as a colonoscopy or flexible sigmoidoscopy) you may notice spotting of blood in your stool or on the toilet paper. If you underwent a bowel prep for your procedure, you may not have a normal bowel movement for a few days.  Please Note:  You might notice some irritation and congestion in your nose or some drainage.  This is from the oxygen used during your procedure.  There is no need for concern and it should clear up in a day or so.  SYMPTOMS TO REPORT IMMEDIATELY:   Following lower endoscopy (colonoscopy or flexible sigmoidoscopy):  Excessive amounts of blood in the stool  Significant tenderness or worsening of abdominal pains  Swelling of the abdomen that is new, acute  Fever of 100F or higher   For urgent or emergent issues, a gastroenterologist can be reached at any hour by calling [phone].   DIET:  We do recommend a small meal at first, but then you may proceed to your regular diet.  Drink plenty of fluids but you should avoid alcoholic beverages for 24 hours.  ACTIVITY:  You should plan to take it easy for the rest of today and you should NOT DRIVE or use heavy machinery until  tomorrow (because of the sedation medicines used during the test).    FOLLOW UP: Our staff will call the number listed on your records the next business day following your procedure to check on you and address any questions or concerns that you may have regarding the information given to you following your procedure. If we do not reach you, we will leave a message.  However, if you are feeling well and you are not experiencing any problems, there is no need to return our call.  We will assume that you have returned to your regular daily activities without incident.  If any biopsies were taken you will be contacted by phone or by letter within the next 1-3 weeks.  Please call us at [phone] if you have not heard about the biopsies in 3 weeks.    SIGNATURES/CONFIDENTIALITY: You and/or your care partner have signed paperwork which will be entered into your electronic medical record.  These signatures attest to the fact that that the information above on your After Visit Summary has been reviewed and is understood.  Full responsibility of the confidentiality of this discharge information lies with you and/or your care-partner. 

## 2018-03-16 NOTE — Op Note (Signed)
[facility] Patient Name: Caleb Carter Procedure Date: 03/16/2018 1:57 PM MRN: [id number] Endoscopist: Ladene Artist , MD Age: 47 Referring MD:  Date of Birth: [birth year] Gender: Male Account #: [id number] Procedure:                Colonoscopy Indications:              Screening for colorectal malignant neoplasm Medicines:                Monitored Anesthesia Care Procedure:                Pre-Anesthesia Assessment:                           - Prior to the procedure, a History and Physical                            was performed, and patient medications and                            allergies were reviewed. The patient's tolerance of                            previous anesthesia was also reviewed. The risks                            and benefits of the procedure and the sedation                            options and risks were discussed with the patient.                            All questions were answered, and informed consent                            was obtained. Prior Anticoagulants: The patient has                            taken no previous anticoagulant or antiplatelet                            agents. ASA Grade Assessment: II - A patient with                            mild systemic disease. After reviewing the risks                            and benefits, the patient was deemed in                            satisfactory condition to undergo the procedure.                           After obtaining informed consent, the colonoscope  was passed under direct vision. Throughout the                            procedure, the patient's blood pressure, pulse, and                            oxygen saturations were monitored continuously. The                            Colonoscope was introduced through the anus and                            advanced to the the cecum, identified by                            appendiceal orifice and  ileocecal valve. The                            ileocecal valve, appendiceal orifice, and rectum                            were photographed. The quality of the bowel                            preparation was good. The colonoscopy was performed                            without difficulty. The patient tolerated the                            procedure well. Scope In: 2:02:52 PM Scope Out: 2:15:45 PM Scope Withdrawal Time: 0 hours 10 minutes 38 seconds  Total Procedure Duration: 0 hours 12 minutes 53 seconds  Findings:                 The perianal and digital rectal examinations were                            normal.                           A 4 mm polyp was found in the descending colon. The                            polyp was sessile. The polyp was removed with a                            cold biopsy forceps. Resection and retrieval were                            complete.                           Two sessile polyps were found in the rectum. The  polyps were 5 to 6 mm in size. These polyps were                            removed with a cold snare. Resection and retrieval                            were complete.                           Multiple small-mouthed diverticula were found in                            the left colon. There was no evidence of                            diverticular bleeding.                           Internal hemorrhoids were found during                            retroflexion. The hemorrhoids were small and Grade                            I (internal hemorrhoids that do not prolapse).                           The exam was otherwise without abnormality on                            direct and retroflexion views. Complications:            No immediate complications. Estimated blood loss:                            None. Estimated Blood Loss:     Estimated blood loss: none. Impression:               - One 4 mm polyp in the  descending colon, removed                            with a cold biopsy forceps. Resected and retrieved.                           - Two 5 to 6 mm polyps in the rectum, removed with                            a cold snare. Resected and retrieved.                           - Moderate diverticulosis in the left colon. There                            was no evidence of diverticular bleeding.                           -  Internal hemorrhoids.                           - The examination was otherwise normal on direct                            and retroflexion views. Recommendation:           - Repeat colonoscopy in 5 years for surveillance if                            polyp(s) are precancerous, otherwise 10 years.                           - Patient has a contact number available for                            emergencies. The signs and symptoms of potential                            delayed complications were discussed with the                            patient. Return to normal activities tomorrow.                            Written discharge instructions were provided to the                            patient.                           - High fiber diet.                           - Continue present medications.                           - Await pathology results. Ladene Artist, MD 03/16/2018 2:19:52 PM This report has been signed electronically.

## 2018-03-16 NOTE — Progress Notes (Signed)
Report to PACU, RN, vss, BBS= Clear.  

## 2018-03-17 ENCOUNTER — Telehealth: Payer: Self-pay | Admitting: *Deleted

## 2018-03-17 NOTE — Telephone Encounter (Signed)
  Follow up Call-  Call back number 03/16/2018  Post procedure Call Back phone  # [phone] or [phone]  Permission to leave phone message Yes  Some recent data might be hidden     Patient questions:  Do you have a fever, pain , or abdominal swelling? No. Pain Score  0 *  Have you tolerated food without any problems? Yes.    Have you been able to return to your normal activities? Yes.    Do you have any questions about your discharge instructions: Diet   No. Medications  No. Follow up visit  No.  Do you have questions or concerns about your Care? No.  Actions: * If pain score is 4 or above: No action needed, pain <4.

## 2018-03-24 ENCOUNTER — Encounter: Payer: Self-pay | Admitting: Gastroenterology

## 2018-03-31 DIAGNOSIS — M76822 Posterior tibial tendinitis, left leg: Secondary | ICD-10-CM | POA: Diagnosis not present

## 2018-04-27 DIAGNOSIS — F330 Major depressive disorder, recurrent, mild: Secondary | ICD-10-CM | POA: Diagnosis not present

## 2018-04-28 DIAGNOSIS — F330 Major depressive disorder, recurrent, mild: Secondary | ICD-10-CM | POA: Diagnosis not present

## 2018-05-03 DIAGNOSIS — I10 Essential (primary) hypertension: Secondary | ICD-10-CM | POA: Diagnosis not present

## 2018-05-03 DIAGNOSIS — G4733 Obstructive sleep apnea (adult) (pediatric): Secondary | ICD-10-CM | POA: Diagnosis not present

## 2018-05-03 DIAGNOSIS — E669 Obesity, unspecified: Secondary | ICD-10-CM | POA: Diagnosis not present

## 2018-05-26 DIAGNOSIS — F330 Major depressive disorder, recurrent, mild: Secondary | ICD-10-CM | POA: Diagnosis not present

## 2018-05-26 DIAGNOSIS — M76822 Posterior tibial tendinitis, left leg: Secondary | ICD-10-CM | POA: Diagnosis not present

## 2018-05-26 DIAGNOSIS — F320 Major depressive disorder, single episode, mild: Secondary | ICD-10-CM | POA: Diagnosis not present

## 2018-06-09 DIAGNOSIS — F330 Major depressive disorder, recurrent, mild: Secondary | ICD-10-CM | POA: Diagnosis not present

## 2018-06-28 DIAGNOSIS — F330 Major depressive disorder, recurrent, mild: Secondary | ICD-10-CM | POA: Diagnosis not present

## 2018-07-21 DIAGNOSIS — F330 Major depressive disorder, recurrent, mild: Secondary | ICD-10-CM | POA: Diagnosis not present

## 2018-08-04 DIAGNOSIS — F330 Major depressive disorder, recurrent, mild: Secondary | ICD-10-CM | POA: Diagnosis not present

## 2018-08-18 DIAGNOSIS — F330 Major depressive disorder, recurrent, mild: Secondary | ICD-10-CM | POA: Diagnosis not present

## 2018-08-23 DIAGNOSIS — F330 Major depressive disorder, recurrent, mild: Secondary | ICD-10-CM | POA: Diagnosis not present

## 2018-09-21 ENCOUNTER — Other Ambulatory Visit: Payer: Self-pay | Admitting: Student

## 2018-09-21 NOTE — Telephone Encounter (Signed)
This is Dr. Hilty's pt 

## 2018-09-22 NOTE — Telephone Encounter (Signed)
Patient calling to check status of refill he is out of meds.    *STAT* If patient is at the pharmacy, call can be transferred to refill team.   1. Which medications need to be refilled? (please list name of each medication and dose if known) hctz 12.5 mg po q d   and lisinopril 20 mg po q d    2. Which pharmacy/location (including street and city if local pharmacy) is medication to be sent to? [address]   3. Do they need a 30 day or 90 day supply? [place]

## 2018-09-29 DIAGNOSIS — F330 Major depressive disorder, recurrent, mild: Secondary | ICD-10-CM | POA: Diagnosis not present

## 2018-10-04 DIAGNOSIS — H903 Sensorineural hearing loss, bilateral: Secondary | ICD-10-CM | POA: Diagnosis not present

## 2018-10-27 DIAGNOSIS — F321 Major depressive disorder, single episode, moderate: Secondary | ICD-10-CM | POA: Diagnosis not present

## 2019-01-07 ENCOUNTER — Other Ambulatory Visit: Payer: Self-pay | Admitting: Student

## 2019-01-28 ENCOUNTER — Other Ambulatory Visit: Payer: Self-pay | Admitting: Student

## 2019-03-29 DIAGNOSIS — F4323 Adjustment disorder with mixed anxiety and depressed mood: Secondary | ICD-10-CM | POA: Diagnosis not present

## 2019-04-19 DIAGNOSIS — F4323 Adjustment disorder with mixed anxiety and depressed mood: Secondary | ICD-10-CM | POA: Diagnosis not present

## 2019-05-02 DIAGNOSIS — F4323 Adjustment disorder with mixed anxiety and depressed mood: Secondary | ICD-10-CM | POA: Diagnosis not present

## 2019-06-02 ENCOUNTER — Other Ambulatory Visit: Payer: Self-pay | Admitting: Student

## 2019-06-25 ENCOUNTER — Other Ambulatory Visit: Payer: Self-pay | Admitting: Student

## 2019-06-28 NOTE — Telephone Encounter (Signed)
Please advise if OK to refill. Last seen 07/2017. Thank you!

## 2019-06-28 NOTE — Telephone Encounter (Signed)
This is Dr. Hilty's pt 

## 2019-06-29 NOTE — Telephone Encounter (Signed)
LOV 2018 needs appt for refills

## 2019-06-29 NOTE — Telephone Encounter (Signed)
LM2CB 

## 2019-07-26 DIAGNOSIS — R21 Rash and other nonspecific skin eruption: Secondary | ICD-10-CM | POA: Diagnosis not present

## 2019-07-26 DIAGNOSIS — I10 Essential (primary) hypertension: Secondary | ICD-10-CM | POA: Diagnosis not present

## 2019-07-26 DIAGNOSIS — R55 Syncope and collapse: Secondary | ICD-10-CM | POA: Diagnosis not present

## 2019-07-26 DIAGNOSIS — H9190 Unspecified hearing loss, unspecified ear: Secondary | ICD-10-CM | POA: Diagnosis not present

## 2019-08-16 DIAGNOSIS — R55 Syncope and collapse: Secondary | ICD-10-CM | POA: Diagnosis not present

## 2019-08-16 DIAGNOSIS — I10 Essential (primary) hypertension: Secondary | ICD-10-CM | POA: Diagnosis not present

## 2019-08-16 DIAGNOSIS — Z0000 Encounter for general adult medical examination without abnormal findings: Secondary | ICD-10-CM | POA: Diagnosis not present

## 2019-08-16 DIAGNOSIS — Z113 Encounter for screening for infections with a predominantly sexual mode of transmission: Secondary | ICD-10-CM | POA: Diagnosis not present

## 2019-10-06 ENCOUNTER — Other Ambulatory Visit: Payer: Self-pay | Admitting: Internal Medicine

## 2019-10-06 NOTE — Telephone Encounter (Signed)
Rx request sent to pharmacy.  

## 2020-01-18 ENCOUNTER — Ambulatory Visit: Payer: Federal, State, Local not specified - PPO | Admitting: Internal Medicine

## 2022-12-24 ENCOUNTER — Other Ambulatory Visit: Payer: Self-pay | Admitting: Physician Assistant

## 2022-12-24 DIAGNOSIS — H903 Sensorineural hearing loss, bilateral: Secondary | ICD-10-CM

## 2023-01-08 ENCOUNTER — Ambulatory Visit: Payer: Federal, State, Local not specified - PPO | Attending: Physician Assistant | Admitting: Physical Therapy

## 2023-01-08 DIAGNOSIS — R42 Dizziness and giddiness: Secondary | ICD-10-CM | POA: Diagnosis present

## 2023-01-08 NOTE — Therapy (Signed)
OUTPATIENT PHYSICAL THERAPY VESTIBULAR EVALUATION     Patient Name: Caleb Carter MRN: [id number] DOB:08-30-1971, 52 y.o., male Today's Date: 01/09/2023  END OF SESSION:  PT End of Session - 01/09/23 1115     Visit Number 1    Number of Visits 4    Date for PT Re-Evaluation 02/06/23    Authorization Type [employer]    PT Start Time [phone]    PT Stop Time 1020    PT Time Calculation (min) 43 min    Activity Tolerance Patient tolerated treatment well    Behavior During Therapy [facility] for tasks assessed/performed             Past Medical History:  Diagnosis Date   Chest pain    a. 03/2017: echo showing a preserved EF of 55-60% with no WMA. NST showing no reversible ischemia or prior infarction, overall a low-risk study.    Family history of early CAD    HOH (hard of hearing)    a. wears hearing aid   Hyperlipidemia    no medications   Hypertension    Sleep apnea    wears CPAP   Tobacco abuse    Past Surgical History:  Procedure Laterality Date   COLONOSCOPY     Patient Active Problem List   Diagnosis Date Noted   Change in bowel habits 01/25/2018   Colon cancer screening 01/25/2018   Bradycardia 04/11/2017   Pleuritic chest pain 04/10/2017   Family history of early CAD    HLD (hyperlipidemia)    Hypertension    Tobacco abuse    HOH (hard of hearing)     PCP: None REFERRING PROVIDER: Jolene Provost, PA-C  REFERRING DIAG:  Diagnosis  R42 (ICD-10-CM) - Dizziness and giddiness    THERAPY DIAG:  Dizziness and giddiness  ONSET DATE: August 2023;   Rationale for Evaluation and Treatment: Rehabilitation  SUBJECTIVE:   SUBJECTIVE STATEMENT: Pt reports he had episode of dizziness in Sept. 2023 when he was at work and almost passed out - says his co-worker caught him to prevent falling; denies spinning but says it is a rocking motion; says it also happens when he bends over and ties his shoes; reports last time he got dizzy at work was around Thanksgiving;  says he is now able to tie shoes without dizziness - pt states it has taken awhile to get appts scheduled since it started in Sept. ; has MRI scheduled on 01-22-23  Pt accompanied by: self  PERTINENT HISTORY: HTN, sleep apnea, bradycardia, HOH (pt wears bil. Hearing aids);    Per chart note "Tam reported the specific concern of imbalance which began 4 months ago. Mr. Hohlt reports 2 recent episodes of "imbalance". He states that he does not get dizzy or experience vertigo, he has a motion sensation, like on a boat. Each episode is associated with standing from a sitting position or head down when tieing shoes."  PAIN:  Are you having pain?  Reports stiffness in neck and shoulders  PRECAUTIONS: None  WEIGHT BEARING RESTRICTIONS: No  FALLS: Has patient fallen in last 6 months? Yes. Number of falls 1    PLOF: Independent  PATIENT GOALS: try to figure out what is going on (regarding the dizziness)  OBJECTIVE:   DIAGNOSTIC FINDINGS: MRI scheduled on 01-22-23  COGNITION: Overall cognitive status: Within functional limits for tasks assessed  Cervical ROM:  WNL's - pt reports tightness in neck with Rt cervical rotation   GAIT: Gait pattern: [facility] Distance walked:  100' Assistive device utilized: None Level of assistance: Complete Independence  PATIENT SURVEYS:  FOTO Np'd due to eval only as no dizziness at this time  VESTIBULAR ASSESSMENT:  GENERAL OBSERVATION: pt is a 52 yr old gentleman with no c/o dizziness or light-headedness at this time.     SYMPTOM BEHAVIOR:  Subjective history: Pt reports he had episode of dizziness at work in Sept. 2023 when he became dizzy and nearly lost his balance - says a co-worker caught him and prevented him from falling; pt reports he has not had any dizziness since around Thanksgiving 2023, but is trying to figure out what is going on:  ENT has ordered MRI on 01-22-23  Non-Vestibular symptoms: tinnitus and pt wears bil. Hearing aids  Type of  dizziness: Lightheadedness/Faint  Frequency: none at this time  Duration: none at this time  Aggravating factors: Induced by position change: bending over  Relieving factors:  staying upright - not bending over  Progression of symptoms: better  OCULOMOTOR EXAM:  Ocular Alignment: normal  Ocular ROM: No Limitations  Spontaneous Nystagmus: absent  Gaze-Induced Nystagmus: absent  Smooth Pursuits: intact  Saccades: intact   FRENZEL - FIXATION SUPRESSED:  Ocular Alignment: normal  Spontaneous Nystagmus: absent  Gaze-Induced Nystagmus: absent   MOTION SENSITIVITY:  Motion Sensitivity Quotient Intensity: 0 = none, 1 = Lightheaded, 2 = Mild, 3 = Moderate, 4 = Severe, 5 = Vomiting  Intensity  1. Sitting to supine   2. Supine to L side   3. Supine to R side   4. Supine to sitting   5. L Hallpike-Dix   6. Up from L    7. R Hallpike-Dix   8. Up from R    9. Sitting, head tipped to L knee 0  10. Head up from L knee 0  11. Sitting, head tipped to R knee 0  12. Head up from R knee 0  13. Sitting head turns x5 0  14.Sitting head nods x5 0  15. In stance, 180 turn to L    16. In stance, 180 turn to R     ORTHOSTATICS: see below    01/08/23 0001  Orthostatics  BP supine (x 5 minutes) 133/87  HR supine (x 5 minutes) 58  BP sitting 138/103  HR sitting 71  BP standing (after 1 minute) 128/93  HR standing (after 1 minute) 72  BP standing (after 3 minutes) 125/97  HR standing (after 3 minutes) 79  Orthostatics Comment 136/94 in seated at start of session; HR 69    VESTIBULAR TREATMENT:                                                                                                   DATE: Eval only  PATIENT EDUCATION: Education details: symptoms consistent with orthostatic hypotension - pt is not symptomatic at this time Person educated: Patient Education method: Explanation Education comprehension: verbalized understanding  HOME EXERCISE PROGRAM: N/A at this  time  GOALS: Goals reviewed with patient? Yes  LONG TERM GOALS: Target date: 02-06-23  HEP to be established as appropriate -  pt has MRI scheduled on 01-22-23; pt reports no dizziness at this time. Baseline:  Goal status: INITIAL  ASSESSMENT:  CLINICAL IMPRESSION: Patient is a 52 y.o. gentleman who was seen today for physical therapy evaluation and treatment for dizziness that started in Sept. 2023.   Pt's description of dizziness is consistent with orthostatic hypotension as pt reports he was getting dizzy with bending over to tie his shoes and returning to upright position.  Pt reports he has not had any dizziness since end of Nov. 2023, however, is trying to figure out what was causing the dizziness. Pt is scheduled for MRI on 01-22-23;  no vestibular system dysfunction which could be causing this type of dizziness was noted during evaluation.  No dizziness reported with any movements.  Will follow up with pt after MRI on 01-22-23.    OBJECTIVE IMPAIRMENTS:  previous episodes of dizziness - last occurrence in Nov. 2023 .   ACTIVITY LIMITATIONS:  None at this time  PARTICIPATION LIMITATIONS:  None  PERSONAL FACTORS:  N/A  are also affecting patient's functional outcome.   REHAB POTENTIAL: Good  CLINICAL DECISION MAKING: Stable/uncomplicated  EVALUATION COMPLEXITY: Low   PLAN:  PT FREQUENCY: 1x/week  PT DURATION: 4 weeks  PLANNED INTERVENTIONS: Therapeutic exercises, Therapeutic activity, Neuromuscular re-education, Balance training, Gait training, Patient/Family education, Self Care, and Vestibular training  PLAN FOR NEXT SESSION: follow up after MRI - do [facility], [place], [place], PT 01/09/2023, 11:16 AM

## 2023-01-09 ENCOUNTER — Encounter: Payer: Self-pay | Admitting: Physical Therapy

## 2023-01-09 NOTE — Progress Notes (Signed)
   01/08/23 0001  Orthostatics  BP supine (x 5 minutes) 133/87  HR supine (x 5 minutes) 58  BP sitting 138/103  HR sitting 71  BP standing (after 1 minute) 128/93  HR standing (after 1 minute) 72  BP standing (after 3 minutes) 125/97  HR standing (after 3 minutes) 79  Orthostatics Comment 136/94 in seated at start of session; HR 69

## 2023-01-22 ENCOUNTER — Ambulatory Visit
Admission: RE | Admit: 2023-01-22 | Discharge: 2023-01-22 | Disposition: A | Discharge status: Home / Self Care | Payer: Federal, State, Local not specified - PPO | Source: Ambulatory Visit | Attending: Physician Assistant | Admitting: Physician Assistant

## 2023-01-22 DIAGNOSIS — H903 Sensorineural hearing loss, bilateral: Secondary | ICD-10-CM

## 2023-01-22 MED ORDER — GADOPICLENOL 0.5 MMOL/ML IV SOLN
10.0000 mL | Freq: Once | INTRAVENOUS | Status: AC | PRN
  Administered 2023-01-22: 10 mL via INTRAVENOUS

## 2023-01-26 ENCOUNTER — Encounter: Payer: Self-pay | Admitting: Gastroenterology

## 2023-01-27 ENCOUNTER — Ambulatory Visit: Payer: Federal, State, Local not specified - PPO | Attending: Physician Assistant | Admitting: Physical Therapy

## 2023-01-27 DIAGNOSIS — R42 Dizziness and giddiness: Secondary | ICD-10-CM | POA: Insufficient documentation

## 2023-01-27 NOTE — Therapy (Signed)
OUTPATIENT PHYSICAL THERAPY VESTIBULAR TREATMENT NOTE/DISCHARGE SUMMARY     Patient Name: Caleb Carter MRN: [id number] DOB:Nov 29, 1970, 52 y.o., male Today's Date: 01/28/2023  END OF SESSION:  PT End of Session - 01/28/23 1723     Visit Number 2    Number of Visits 4    Date for PT Re-Evaluation 02/06/23    Authorization Type [employer]    PT Start Time 1026   arrived 10" late for PT appt   PT Stop Time 1055    PT Time Calculation (min) 29 min    Activity Tolerance Patient tolerated treatment well    Behavior During Therapy WFL for tasks assessed/performed              Past Medical History:  Diagnosis Date   Chest pain    a. 03/2017: echo showing a preserved EF of 55-60% with no WMA. NST showing no reversible ischemia or prior infarction, overall a low-risk study.    Family history of early CAD    HOH (hard of hearing)    a. wears hearing aid   Hyperlipidemia    no medications   Hypertension    Sleep apnea    wears CPAP   Tobacco abuse    Past Surgical History:  Procedure Laterality Date   COLONOSCOPY     Patient Active Problem List   Diagnosis Date Noted   Change in bowel habits 01/25/2018   Colon cancer screening 01/25/2018   Bradycardia 04/11/2017   Pleuritic chest pain 04/10/2017   Family history of early CAD    HLD (hyperlipidemia)    Hypertension    Tobacco abuse    HOH (hard of hearing)     PCP: None REFERRING PROVIDER: Aquilla Hacker, PA-C  REFERRING DIAG:  Diagnosis  R42 (ICD-10-CM) - Dizziness and giddiness    THERAPY DIAG:  Dizziness and giddiness  ONSET DATE: August 2023;   Rationale for Evaluation and Treatment: Rehabilitation  SUBJECTIVE:   SUBJECTIVE STATEMENT: Pt reports he has not had any vertigo/dizziness since he was here 3 weeks ago;  had the MRI but luckily it didn't show anything wrong Pt accompanied by: self  PERTINENT HISTORY: HTN, sleep apnea, bradycardia, HOH (pt wears bil. Hearing aids);    Per chart note  "Derwood reported the specific concern of imbalance which began 4 months ago. Mr. Kilkenny reports 2 recent episodes of "imbalance". He states that he does not get dizzy or experience vertigo, he has a motion sensation, like on a boat. Each episode is associated with standing from a sitting position or head down when tieing shoes."  PAIN:  Are you having pain?  Reports stiffness in neck and shoulders  PRECAUTIONS: None  WEIGHT BEARING RESTRICTIONS: No  FALLS: Has patient fallen in last 6 months? Yes. Number of falls 1    PLOF: Independent  PATIENT GOALS: try to figure out what is going on (regarding the dizziness)  OBJECTIVE:    GAIT: Gait pattern: WFL Distance walked: 100' Assistive device utilized: None Level of assistance: Complete Independence  Neuro Re-ed:  SVA - line 11:  DVA line 8 (mildly abnormal as > 2 line difference but pt had no c/o dizziness upon completion of test) McTSIB:  condition 1 - 30 secs;  condition 2 - 30 secs;  condition 3 - 30 secs; condition 4 - 30 secs with mild postural sway but no LOB  Self Care;  discussed results of MRI - possible acute sinusitis;  no HEP needed due to vertigo resolved  at this time  PATIENT EDUCATION: Education details: MRI results Person educated: Patient Education method: Explanation Education comprehension: verbalized understanding  HOME EXERCISE PROGRAM: N/A at this time  GOALS: Goals reviewed with patient? Yes  LONG TERM GOALS: Target date: 02-06-23  HEP to be established as appropriate - pt has MRI scheduled on 01-22-23; pt reports no dizziness at this time. Baseline:  Goal status: INITIAL  ASSESSMENT:  CLINICAL IMPRESSION: Patient reports dizziness has fully resolved as of this time - etiology remains unknown but pt states MRI revealed no abnormality.  DVA slightly abnormal with a 3 line difference but pt had no c/o dizziness upon completion of test.  McTSIB is WNL's with pt able to hold position on all 4  conditions without LOB.  Discharge due to resolution of dizziness at this time.    OBJECTIVE IMPAIRMENTS:  previous episodes of dizziness - last occurrence in Nov. 2023 .   ACTIVITY LIMITATIONS:  None at this time  PARTICIPATION LIMITATIONS:  None  PERSONAL FACTORS:  N/A  are also affecting patient's functional outcome.   REHAB POTENTIAL: Good  CLINICAL DECISION MAKING: Stable/uncomplicated  EVALUATION COMPLEXITY: Low   PLAN:  PT FREQUENCY: 1x/week  PT DURATION: 4 weeks  PLANNED INTERVENTIONS: Therapeutic exercises, Therapeutic activity, Neuromuscular re-education, Balance training, Gait training, Patient/Family education, Self Care, and Vestibular training  PLAN FOR NEXT SESSION: N/A - D/C on 01-27-23    PHYSICAL THERAPY DISCHARGE SUMMARY  Visits from Start of Care: 2  Current functional level related to goals / functional outcomes: Dizziness has fully resolved at this time   Remaining deficits: None regarding dizziness; gait & balance are WNL's   Education / Equipment: N/A - no HEP warranted due to no deficits regarding dizziness or vestibular input in balance at this time   Patient agrees to discharge. Patient goals were met. Patient is being discharged due to meeting the stated rehab goals.    Kary Kos, PT 01/28/2023, 5:24 PM

## 2023-01-28 ENCOUNTER — Encounter: Payer: Self-pay | Admitting: Physical Therapy
